# Patient Record
Sex: Male | Born: 1990 | Hispanic: Yes | Marital: Married | Smoking: Never smoker
Health system: Southern US, Community
[De-identification: ages and names within clinical notes are randomized; demographics above are authoritative.]

## PROBLEM LIST (undated history)

## (undated) DIAGNOSIS — J45909 Unspecified asthma, uncomplicated: Secondary | ICD-10-CM

## (undated) DIAGNOSIS — E119 Type 2 diabetes mellitus without complications: Secondary | ICD-10-CM

## (undated) DIAGNOSIS — K219 Gastro-esophageal reflux disease without esophagitis: Secondary | ICD-10-CM

## (undated) HISTORY — PX: NO PAST SURGERIES: SHX2092

## (undated) HISTORY — PX: APPENDECTOMY: SHX54

---

## 2016-08-17 DIAGNOSIS — R4586 Emotional lability: Secondary | ICD-10-CM | POA: Insufficient documentation

## 2016-08-17 DIAGNOSIS — IMO0002 Reserved for concepts with insufficient information to code with codable children: Secondary | ICD-10-CM | POA: Insufficient documentation

## 2017-05-27 DIAGNOSIS — Z131 Encounter for screening for diabetes mellitus: Secondary | ICD-10-CM | POA: Insufficient documentation

## 2017-10-06 DIAGNOSIS — R37 Sexual dysfunction, unspecified: Secondary | ICD-10-CM | POA: Insufficient documentation

## 2018-02-20 DIAGNOSIS — E1069 Type 1 diabetes mellitus with other specified complication: Secondary | ICD-10-CM | POA: Insufficient documentation

## 2018-02-20 DIAGNOSIS — E785 Hyperlipidemia, unspecified: Secondary | ICD-10-CM | POA: Insufficient documentation

## 2018-07-19 DIAGNOSIS — E559 Vitamin D deficiency, unspecified: Secondary | ICD-10-CM | POA: Insufficient documentation

## 2019-01-04 DIAGNOSIS — K8020 Calculus of gallbladder without cholecystitis without obstruction: Secondary | ICD-10-CM | POA: Insufficient documentation

## 2019-01-10 DIAGNOSIS — E1343 Other specified diabetes mellitus with diabetic autonomic (poly)neuropathy: Secondary | ICD-10-CM | POA: Insufficient documentation

## 2019-02-22 DIAGNOSIS — M14671 Charcot's joint, right ankle and foot: Secondary | ICD-10-CM | POA: Insufficient documentation

## 2019-02-22 DIAGNOSIS — M216X1 Other acquired deformities of right foot: Secondary | ICD-10-CM | POA: Insufficient documentation

## 2019-03-21 DIAGNOSIS — G894 Chronic pain syndrome: Secondary | ICD-10-CM | POA: Insufficient documentation

## 2019-04-30 DIAGNOSIS — Z9641 Presence of insulin pump (external) (internal): Secondary | ICD-10-CM | POA: Insufficient documentation

## 2019-06-28 DIAGNOSIS — R61 Generalized hyperhidrosis: Secondary | ICD-10-CM | POA: Insufficient documentation

## 2019-11-13 ENCOUNTER — Emergency Department: Payer: PRIVATE HEALTH INSURANCE

## 2019-11-13 ENCOUNTER — Other Ambulatory Visit: Payer: Self-pay

## 2019-11-13 ENCOUNTER — Inpatient Hospital Stay
Admission: EM | Admit: 2019-11-13 | Discharge: 2019-11-15 | DRG: 373 | Disposition: A | Discharge status: Home / Self Care | Payer: PRIVATE HEALTH INSURANCE | Attending: Surgery | Admitting: Surgery

## 2019-11-13 DIAGNOSIS — Z20828 Contact with and (suspected) exposure to other viral communicable diseases: Secondary | ICD-10-CM | POA: Diagnosis present

## 2019-11-13 DIAGNOSIS — Z794 Long term (current) use of insulin: Secondary | ICD-10-CM | POA: Diagnosis not present

## 2019-11-13 DIAGNOSIS — E109 Type 1 diabetes mellitus without complications: Secondary | ICD-10-CM | POA: Diagnosis present

## 2019-11-13 DIAGNOSIS — Z79899 Other long term (current) drug therapy: Secondary | ICD-10-CM

## 2019-11-13 DIAGNOSIS — K3532 Acute appendicitis with perforation and localized peritonitis, without abscess: Secondary | ICD-10-CM | POA: Diagnosis not present

## 2019-11-13 DIAGNOSIS — K3533 Acute appendicitis with perforation and localized peritonitis, with abscess: Principal | ICD-10-CM | POA: Diagnosis present

## 2019-11-13 HISTORY — DX: Type 2 diabetes mellitus without complications: E11.9

## 2019-11-13 LAB — CBC WITH DIFFERENTIAL/PLATELET
Abs Immature Granulocytes: 0.09 10*3/uL — ABNORMAL HIGH (ref 0.00–0.07)
Basophils Absolute: 0 10*3/uL (ref 0.0–0.1)
Basophils Relative: 0 %
Eosinophils Absolute: 0 10*3/uL (ref 0.0–0.5)
Eosinophils Relative: 0 %
HCT: 36.2 % — ABNORMAL LOW (ref 39.0–52.0)
Hemoglobin: 12.3 g/dL — ABNORMAL LOW (ref 13.0–17.0)
Immature Granulocytes: 1 %
Lymphocytes Relative: 12 %
Lymphs Abs: 1.3 10*3/uL (ref 0.7–4.0)
MCH: 31 pg (ref 26.0–34.0)
MCHC: 34 g/dL (ref 30.0–36.0)
MCV: 91.2 fL (ref 80.0–100.0)
Monocytes Absolute: 1.2 10*3/uL — ABNORMAL HIGH (ref 0.1–1.0)
Monocytes Relative: 11 %
Neutro Abs: 8.1 10*3/uL — ABNORMAL HIGH (ref 1.7–7.7)
Neutrophils Relative %: 76 %
Platelets: 251 10*3/uL (ref 150–400)
RBC: 3.97 MIL/uL — ABNORMAL LOW (ref 4.22–5.81)
RDW: 12.3 % (ref 11.5–15.5)
WBC: 10.7 10*3/uL — ABNORMAL HIGH (ref 4.0–10.5)
nRBC: 0 % (ref 0.0–0.2)

## 2019-11-13 LAB — URINALYSIS, COMPLETE (UACMP) WITH MICROSCOPIC
Bacteria, UA: NONE SEEN
Bilirubin Urine: NEGATIVE
Glucose, UA: >=500 mg/dL — AB
Ketones, ur: NEGATIVE mg/dL
Leukocytes,Ua: NEGATIVE
Nitrite: NEGATIVE
Protein, ur: 30 mg/dL — AB
Specific Gravity, Urine: 1.02 (ref 1.005–1.030)
Squamous Epithelial / HPF: NONE SEEN (ref 0–5)
pH: 5 (ref 5.0–8.0)

## 2019-11-13 LAB — GLUCOSE, CAPILLARY
Glucose-Capillary: 150 mg/dL — ABNORMAL HIGH (ref 70–99)
Glucose-Capillary: 62 mg/dL — ABNORMAL LOW (ref 70–99)

## 2019-11-13 LAB — COMPREHENSIVE METABOLIC PANEL
ALT: 26 U/L (ref 0–44)
AST: 25 U/L (ref 15–41)
Albumin: 3.5 g/dL (ref 3.5–5.0)
Alkaline Phosphatase: 145 U/L — ABNORMAL HIGH (ref 38–126)
Anion gap: 12 (ref 5–15)
BUN: 17 mg/dL (ref 6–20)
CO2: 26 mmol/L (ref 22–32)
Calcium: 8.6 mg/dL — ABNORMAL LOW (ref 8.9–10.3)
Chloride: 94 mmol/L — ABNORMAL LOW (ref 98–111)
Creatinine, Ser: 1.49 mg/dL — ABNORMAL HIGH (ref 0.61–1.24)
GFR calc Af Amer: >60 mL/min (ref >60)
GFR calc non Af Amer: >60 mL/min (ref >60)
Glucose, Bld: 255 mg/dL — ABNORMAL HIGH (ref 70–99)
Potassium: 3.5 mmol/L (ref 3.5–5.1)
Sodium: 132 mmol/L — ABNORMAL LOW (ref 135–145)
Total Bilirubin: 0.7 mg/dL (ref 0.3–1.2)
Total Protein: 7.7 g/dL (ref 6.5–8.1)

## 2019-11-13 LAB — RESPIRATORY PANEL BY RT PCR (FLU A&B, COVID)
Influenza A by PCR: NEGATIVE
Influenza B by PCR: NEGATIVE
SARS Coronavirus 2 by RT PCR: NEGATIVE

## 2019-11-13 LAB — LACTIC ACID, PLASMA: Lactic Acid, Venous: 0.9 mmol/L (ref 0.5–1.9)

## 2019-11-13 MED ORDER — PANTOPRAZOLE SODIUM 40 MG IV SOLR
40.0000 mg | Freq: Every day | INTRAVENOUS | Status: DC
  Administered 2019-11-14 (×2): 40 mg via INTRAVENOUS
  Filled 2019-11-13 (×2): qty 40

## 2019-11-13 MED ORDER — MORPHINE SULFATE (PF) 4 MG/ML IV SOLN
4.0000 mg | INTRAVENOUS | Status: DC | PRN
  Administered 2019-11-13 – 2019-11-14 (×2): 4 mg via INTRAVENOUS
  Filled 2019-11-13 (×2): qty 1

## 2019-11-13 MED ORDER — SODIUM CHLORIDE 0.9 % IV BOLUS
1000.0000 mL | Freq: Once | INTRAVENOUS | Status: AC
  Administered 2019-11-13: 1000 mL via INTRAVENOUS

## 2019-11-13 MED ORDER — SODIUM CHLORIDE 0.9% FLUSH: 3.0000 mL | Freq: Once | INTRAVENOUS | Status: DC

## 2019-11-13 MED ORDER — SODIUM CHLORIDE 0.9 % IV SOLN: INTRAVENOUS | Status: DC

## 2019-11-13 MED ORDER — SODIUM CHLORIDE 0.9 % IV SOLN: Freq: Once | INTRAVENOUS | Status: AC

## 2019-11-13 MED ORDER — INSULIN ASPART 100 UNIT/ML ~~LOC~~ SOLN
5.0000 [IU] | Freq: Once | SUBCUTANEOUS | Status: AC
  Administered 2019-11-13: 2 [IU] via INTRAVENOUS
  Filled 2019-11-13: qty 1

## 2019-11-13 MED ORDER — ACETAMINOPHEN 500 MG PO TABS
1000.0000 mg | ORAL_TABLET | Freq: Four times a day (QID) | ORAL | Status: DC
  Administered 2019-11-14 – 2019-11-15 (×2): 1000 mg via ORAL
  Filled 2019-11-13 (×3): qty 2

## 2019-11-13 MED ORDER — IOHEXOL 300 MG/ML  SOLN
100.0000 mL | Freq: Once | INTRAMUSCULAR | Status: AC | PRN
  Administered 2019-11-13: 100 mL via INTRAVENOUS
  Filled 2019-11-13: qty 100

## 2019-11-13 MED ORDER — METOPROLOL TARTRATE 5 MG/5ML IV SOLN: 5.0000 mg | Freq: Four times a day (QID) | INTRAVENOUS | Status: DC | PRN

## 2019-11-13 MED ORDER — ONDANSETRON HCL 4 MG/2ML IJ SOLN
4.0000 mg | Freq: Four times a day (QID) | INTRAMUSCULAR | Status: DC | PRN
  Administered 2019-11-14 – 2019-11-15 (×4): 4 mg via INTRAVENOUS
  Filled 2019-11-13 (×4): qty 2

## 2019-11-13 MED ORDER — PIPERACILLIN-TAZOBACTAM 3.375 G IVPB
3.3750 g | Freq: Three times a day (TID) | INTRAVENOUS | Status: DC
  Administered 2019-11-14 – 2019-11-15 (×5): 3.375 g via INTRAVENOUS
  Filled 2019-11-13 (×5): qty 50

## 2019-11-13 MED ORDER — ONDANSETRON 4 MG PO TBDP: 4.0000 mg | ORAL_TABLET | Freq: Four times a day (QID) | ORAL | Status: DC | PRN

## 2019-11-13 MED ORDER — PROCHLORPERAZINE EDISYLATE 10 MG/2ML IJ SOLN
5.0000 mg | Freq: Four times a day (QID) | INTRAMUSCULAR | Status: DC | PRN
  Administered 2019-11-14 – 2019-11-15 (×3): 10 mg via INTRAVENOUS
  Filled 2019-11-13 (×3): qty 2

## 2019-11-13 MED ORDER — INSULIN ASPART 100 UNIT/ML ~~LOC~~ SOLN: 0.0000 [IU] | Freq: Every day | SUBCUTANEOUS | Status: DC

## 2019-11-13 MED ORDER — ONDANSETRON HCL 4 MG/2ML IJ SOLN
4.0000 mg | Freq: Once | INTRAMUSCULAR | Status: AC
  Administered 2019-11-13: 4 mg via INTRAVENOUS
  Filled 2019-11-13: qty 2

## 2019-11-13 MED ORDER — PROCHLORPERAZINE MALEATE 10 MG PO TABS
10.0000 mg | ORAL_TABLET | Freq: Four times a day (QID) | ORAL | Status: DC | PRN
  Filled 2019-11-13: qty 1

## 2019-11-13 MED ORDER — INSULIN ASPART 100 UNIT/ML ~~LOC~~ SOLN: 4.0000 [IU] | Freq: Three times a day (TID) | SUBCUTANEOUS | Status: DC

## 2019-11-13 MED ORDER — PIPERACILLIN-TAZOBACTAM 3.375 G IVPB 30 MIN
3.3750 g | Freq: Once | INTRAVENOUS | Status: AC
  Administered 2019-11-13: 3.375 g via INTRAVENOUS
  Filled 2019-11-13: qty 50

## 2019-11-13 MED ORDER — MORPHINE SULFATE (PF) 4 MG/ML IV SOLN
4.0000 mg | INTRAVENOUS | Status: DC | PRN
  Administered 2019-11-14 (×2): 4 mg via INTRAVENOUS
  Filled 2019-11-13 (×2): qty 1

## 2019-11-13 MED ORDER — KETOROLAC TROMETHAMINE 30 MG/ML IJ SOLN
15.0000 mg | Freq: Four times a day (QID) | INTRAMUSCULAR | Status: DC | PRN
  Administered 2019-11-14: 15 mg via INTRAVENOUS
  Filled 2019-11-13: qty 1

## 2019-11-13 MED ORDER — INSULIN ASPART 100 UNIT/ML ~~LOC~~ SOLN: 0.0000 [IU] | Freq: Three times a day (TID) | SUBCUTANEOUS | Status: DC

## 2019-11-13 MED ORDER — OXYCODONE HCL 5 MG PO TABS: 5.0000 mg | ORAL_TABLET | ORAL | Status: DC | PRN

## 2019-11-13 NOTE — ED Triage Notes (Signed)
Pt c/o foul smelling, dark brown urine and right flank pain, n/v x3 days. Pt denies difficulty urinating, was seen recently and dx with possible kidney stone.

## 2019-11-13 NOTE — ED Provider Notes (Addendum)
[facility] Emergency Department Provider Note    First MD Initiated Contact with Patient 11/13/19 1955     (approximate)  I have reviewed the triage vital signs and the nursing notes.   HISTORY  Chief Complaint Flank Pain    HPI Richard [name] is a 28 y.o. male history of diabetes presents the ER for 3 days of worsening right flank pain right lower quadrant discomfort.  Has been having some chills but no measured fevers at home.  Was having nausea.  States he was unable to keep anything down but try to eat something around 4:00 today.  States has been having darker colored urine.  Thinks that may be related to kidney stones.  Does have a family history of kidney stones.  No previous intra-abdominal surgeries.  No diarrhea or constipation.     Past Medical History:  Diagnosis Date  . Diabetes mellitus without complication (HCC)    type one   History reviewed. No pertinent family history. History reviewed. No pertinent surgical history. There are no problems to display for this patient.     Prior to Admission medications   Not on File    Allergies Sulfa antibiotics    Social History Social History   Tobacco Use  . Smoking status: Never Smoker  . Smokeless tobacco: Never Used  Substance Use Topics  . Alcohol use: Never  . Drug use: Never    Review of Systems Patient denies headaches, rhinorrhea, blurry vision, numbness, shortness of breath, chest pain, edema, cough, abdominal pain, nausea, vomiting, diarrhea, dysuria, fevers, rashes or hallucinations unless otherwise stated above in HPI. ____________________________________________   PHYSICAL EXAM:  VITAL SIGNS: Vitals:   11/13/19 1831 11/13/19 1848  BP: 134/77   Pulse: (!) 120   Resp: (!) 28   Temp: (!) 102.2 F (39 C)   SpO2: 98% 98%    Constitutional: Alert and oriented.  Eyes: Conjunctivae are normal.  Head: Atraumatic. Nose: No  congestion/rhinnorhea. Mouth/Throat: Mucous membranes are moist.   Neck: No stridor. Painless ROM.  Cardiovascular: Normal rate, regular rhythm. Grossly normal heart sounds.  Good peripheral circulation. Respiratory: Normal respiratory effort.  No retractions. Lungs CTAB. Gastrointestinal: Soft  With mild ttp in RLQ, no guarding. No distention. No abdominal bruits. No CVA tenderness. Genitourinary:  Musculoskeletal: No lower extremity tenderness nor edema.  No joint effusions. Neurologic:  Normal speech and language. No gross focal neurologic deficits are appreciated. No facial droop Skin:  Skin is warm, dry and intact. No rash noted. Psychiatric: Mood and affect are normal. Speech and behavior are normal.  ____________________________________________   LABS (all labs ordered are listed, but only abnormal results are displayed)  Results for orders placed or performed during the hospital encounter of 11/13/19 (from the past 24 hour(s))  Lactic acid, plasma     Status: None   Collection Time: 11/13/19  6:38 PM  Result Value Ref Range   Lactic Acid, Venous 0.9 0.5 - 1.9 mmol/L  Comprehensive metabolic panel     Status: Abnormal   Collection Time: 11/13/19  6:38 PM  Result Value Ref Range   Sodium 132 (L) 135 - 145 mmol/L   Potassium 3.5 3.5 - 5.1 mmol/L   Chloride 94 (L) 98 - 111 mmol/L   CO2 26 22 - 32 mmol/L   Glucose, Bld 255 (H) 70 - 99 mg/dL   BUN 17 6 - 20 mg/dL   Creatinine, Ser 9.561.49 (H) 0.61 - 1.24 mg/dL   Calcium 8.6 (  L) 8.9 - 10.3 mg/dL   Total Protein 7.7 6.5 - 8.1 g/dL   Albumin 3.5 3.5 - 5.0 g/dL   AST 25 15 - 41 U/L   ALT 26 0 - 44 U/L   Alkaline Phosphatase 145 (H) 38 - 126 U/L   Total Bilirubin 0.7 0.3 - 1.2 mg/dL   GFR calc non Af Amer >60 >60 mL/min   GFR calc Af Amer >60 >60 mL/min   Anion gap 12 5 - 15  CBC with Differential     Status: Abnormal   Collection Time: 11/13/19  6:38 PM  Result Value Ref Range   WBC 10.7 (H) 4.0 - 10.5 K/uL   RBC 3.97 (L)  4.22 - 5.81 MIL/uL   Hemoglobin 12.3 (L) 13.0 - 17.0 g/dL   HCT 36.2 (L) 39.0 - 52.0 %   MCV 91.2 80.0 - 100.0 fL   MCH 31.0 26.0 - 34.0 pg   MCHC 34.0 30.0 - 36.0 g/dL   RDW 12.3 11.5 - 15.5 %   Platelets 251 150 - 400 K/uL   nRBC 0.0 0.0 - 0.2 %   Neutrophils Relative % 76 %   Neutro Abs 8.1 (H) 1.7 - 7.7 K/uL   Lymphocytes Relative 12 %   Lymphs Abs 1.3 0.7 - 4.0 K/uL   Monocytes Relative 11 %   Monocytes Absolute 1.2 (H) 0.1 - 1.0 K/uL   Eosinophils Relative 0 %   Eosinophils Absolute 0.0 0.0 - 0.5 K/uL   Basophils Relative 0 %   Basophils Absolute 0.0 0.0 - 0.1 K/uL   Immature Granulocytes 1 %   Abs Immature Granulocytes 0.09 (H) 0.00 - 0.07 K/uL  Urinalysis, Complete w Microscopic     Status: Abnormal   Collection Time: 11/13/19  6:38 PM  Result Value Ref Range   Color, Urine YELLOW (A) YELLOW   APPearance CLEAR (A) CLEAR   Specific Gravity, Urine 1.020 1.005 - 1.030   pH 5.0 5.0 - 8.0   Glucose, UA >=500 (A) NEGATIVE mg/dL   Hgb urine dipstick SMALL (A) NEGATIVE   Bilirubin Urine NEGATIVE NEGATIVE   Ketones, ur NEGATIVE NEGATIVE mg/dL   Protein, ur 30 (A) NEGATIVE mg/dL   Nitrite NEGATIVE NEGATIVE   Leukocytes,Ua NEGATIVE NEGATIVE   RBC / HPF 0-5 0 - 5 RBC/hpf   WBC, UA 0-5 0 - 5 WBC/hpf   Bacteria, UA NONE SEEN NONE SEEN   Squamous Epithelial / LPF NONE SEEN 0 - 5   Mucus PRESENT   Glucose, capillary     Status: Abnormal   Collection Time: 11/13/19  9:09 PM  Result Value Ref Range   Glucose-Capillary 150 (H) 70 - 99 mg/dL   ____________________________________________ _______________________________  RADIOLOGY  I personally reviewed all radiographic images ordered to evaluate for the above acute complaints and reviewed radiology reports and findings.  These findings were personally discussed with the patient.  Please see medical record for radiology report.  ____________________________________________   PROCEDURES  Procedure(s) performed:  .Critical  Care Performed by: Merlyn Lot, MD Authorized by: Merlyn Lot, MD   Critical care provider statement:    Critical care time (minutes):  30   Critical care was necessary to treat or prevent imminent or life-threatening deterioration of the following conditions: appendicits with perforation and abscess.   Critical care was time spent personally by me on the following activities:  Discussions with consultants, evaluation of patient's response to treatment, examination of patient, ordering and performing treatments and interventions, ordering and  review of laboratory studies, ordering and review of radiographic studies, pulse oximetry, re-evaluation of patient's condition, obtaining history from patient or surrogate and review of old charts      Critical Care performed: yes ____________________________________________   INITIAL IMPRESSION / ASSESSMENT AND PLAN / ED COURSE  Pertinent labs & imaging results that were available during my care of the patient were reviewed by me and considered in my medical decision making (see chart for details).   DDX: Undecided, colitis, perforation, abscess, stone, pyelonephritis  Richard [name] is a 28 y.o. who presents to the ED with symptoms as described above.  Patient febrile tachycardic but fairly well-appearing clinically.  Given his history of diabetes and progressively worsening right lower quadrant pain CT imaging ordered due to concern for the by differential.  Urinalysis without any evidence of infection.  Mild leukocytosis.  IV fluids started.  Patient found to have acute appendicitis with perforation abscess.  IV antibiotics initiated.  Discussed case in consultation with Dr. Everlene Farrier of general surgery who will admit patient for further management.  Insulin given for hyperglycemia.  Have discussed with the patient and available family all diagnostics and treatments performed thus far and all questions were answered to the best of my  ability. The patient demonstrates understanding and agreement with plan.   Clinical Course as of Nov 12 2121  Tue Nov 13, 2019  2117 Case discussed with Dr. Everlene Farrier.  ALT: 26 [PR]  2121 Patient reassessed.  Remains clinically stable.  Blood sugar improving.  Heart rate improving.  Antibiotics infused.   [PR]    Clinical Course User Index [PR] Willy Eddy, MD    The patient was evaluated in Emergency Department today for the symptoms described in the history of present illness. He/she was evaluated in the context of the global COVID-19 pandemic, which necessitated consideration that the patient might be at risk for infection with the SARS-CoV-2 virus that causes COVID-19. Institutional protocols and algorithms that pertain to the evaluation of patients at risk for COVID-19 are in a state of rapid change based on information released by regulatory bodies including the CDC and federal and state organizations. These policies and algorithms were followed during the patient's care in the ED.  As part of my medical decision making, I reviewed the following data within the electronic MEDICAL RECORD NUMBER Nursing notes reviewed and incorporated, Labs reviewed, notes from prior ED visits and [place] Controlled Substance Database   ____________________________________________   FINAL CLINICAL IMPRESSION(S) / ED DIAGNOSES  Final diagnoses:  Acute appendicitis with perforation, localized peritonitis, and abscess, unspecified whether gangrene present      NEW MEDICATIONS STARTED DURING THIS VISIT:  New Prescriptions   No medications on file     Note:  This document was prepared using Dragon voice recognition software and may include unintentional dictation errors.    Willy Eddy, MD 11/13/19 2121    Willy Eddy, MD 11/13/19 2122

## 2019-11-13 NOTE — Progress Notes (Signed)
D/W Dr. Quentin Cornwall 28 yo w abd pain, CT pers. Reviewed showing appendicitis w abscess Phlegmon and significant inflammatory response on RLQ. Per Dr. Quentin Cornwall pt walking and not peritonitic. We will do aggressive fluid resuscitation, insulin for DM and Place order for IR drainage. Unsure if Ir will be able to perform it. If he does not improve may need surgical intervention that may consist of Right colectomy given inflammation. We will try less aggressive approach first.

## 2019-11-14 ENCOUNTER — Encounter: Payer: Self-pay | Admitting: Surgery

## 2019-11-14 ENCOUNTER — Other Ambulatory Visit: Payer: Self-pay

## 2019-11-14 DIAGNOSIS — K3533 Acute appendicitis with perforation and localized peritonitis, with abscess: Principal | ICD-10-CM

## 2019-11-14 LAB — GLUCOSE, CAPILLARY
Glucose-Capillary: 144 mg/dL — ABNORMAL HIGH (ref 70–99)
Glucose-Capillary: 147 mg/dL — ABNORMAL HIGH (ref 70–99)
Glucose-Capillary: 177 mg/dL — ABNORMAL HIGH (ref 70–99)
Glucose-Capillary: 222 mg/dL — ABNORMAL HIGH (ref 70–99)
Glucose-Capillary: 229 mg/dL — ABNORMAL HIGH (ref 70–99)
Glucose-Capillary: 58 mg/dL — ABNORMAL LOW (ref 70–99)
Glucose-Capillary: 79 mg/dL (ref 70–99)

## 2019-11-14 LAB — HEMOGLOBIN A1C
Hgb A1c MFr Bld: 7.3 % — ABNORMAL HIGH (ref 4.8–5.6)
Mean Plasma Glucose: 162.81 mg/dL

## 2019-11-14 LAB — CBC
HCT: 32.8 % — ABNORMAL LOW (ref 39.0–52.0)
Hemoglobin: 11.3 g/dL — ABNORMAL LOW (ref 13.0–17.0)
MCH: 30.5 pg (ref 26.0–34.0)
MCHC: 34.5 g/dL (ref 30.0–36.0)
MCV: 88.6 fL (ref 80.0–100.0)
Platelets: 230 10*3/uL (ref 150–400)
RBC: 3.7 MIL/uL — ABNORMAL LOW (ref 4.22–5.81)
RDW: 12.7 % (ref 11.5–15.5)
WBC: 10.8 10*3/uL — ABNORMAL HIGH (ref 4.0–10.5)
nRBC: 0 % (ref 0.0–0.2)

## 2019-11-14 LAB — COMPREHENSIVE METABOLIC PANEL
ALT: 29 U/L (ref 0–44)
AST: 34 U/L (ref 15–41)
Albumin: 2.8 g/dL — ABNORMAL LOW (ref 3.5–5.0)
Alkaline Phosphatase: 120 U/L (ref 38–126)
Anion gap: 9 (ref 5–15)
BUN: 13 mg/dL (ref 6–20)
CO2: 26 mmol/L (ref 22–32)
Calcium: 7.7 mg/dL — ABNORMAL LOW (ref 8.9–10.3)
Chloride: 102 mmol/L (ref 98–111)
Creatinine, Ser: 1.23 mg/dL (ref 0.61–1.24)
GFR calc Af Amer: >60 mL/min (ref >60)
GFR calc non Af Amer: >60 mL/min (ref >60)
Glucose, Bld: 65 mg/dL — ABNORMAL LOW (ref 70–99)
Potassium: 3.3 mmol/L — ABNORMAL LOW (ref 3.5–5.1)
Sodium: 137 mmol/L (ref 135–145)
Total Bilirubin: 0.7 mg/dL (ref 0.3–1.2)
Total Protein: 6.6 g/dL (ref 6.5–8.1)

## 2019-11-14 LAB — APTT: aPTT: 43 seconds — ABNORMAL HIGH (ref 24–36)

## 2019-11-14 LAB — PROTIME-INR
INR: 1.1 (ref 0.8–1.2)
Prothrombin Time: 14.3 seconds (ref 11.4–15.2)

## 2019-11-14 LAB — MAGNESIUM: Magnesium: 1.8 mg/dL (ref 1.7–2.4)

## 2019-11-14 MED ORDER — DEXTROSE 50 % IV SOLN
INTRAVENOUS | Status: AC
  Administered 2019-11-14: 50 mL
  Filled 2019-11-14: qty 50

## 2019-11-14 MED ORDER — INSULIN ASPART 100 UNIT/ML ~~LOC~~ SOLN
0.0000 [IU] | SUBCUTANEOUS | Status: DC
  Administered 2019-11-14 (×2): 2 [IU] via SUBCUTANEOUS
  Administered 2019-11-15 (×4): 1 [IU] via SUBCUTANEOUS
  Filled 2019-11-14 (×5): qty 1

## 2019-11-14 MED ORDER — INSULIN GLARGINE 100 UNIT/ML ~~LOC~~ SOLN
15.0000 [IU] | Freq: Two times a day (BID) | SUBCUTANEOUS | Status: DC
  Administered 2019-11-14 – 2019-11-15 (×3): 15 [IU] via SUBCUTANEOUS
  Filled 2019-11-14 (×5): qty 0.15

## 2019-11-14 MED ORDER — INSULIN ASPART 100 UNIT/ML ~~LOC~~ SOLN
0.0000 [IU] | Freq: Three times a day (TID) | SUBCUTANEOUS | Status: DC
  Filled 2019-11-14: qty 1

## 2019-11-14 NOTE — Progress Notes (Signed)
   11/14/19 1500  Clinical Encounter Type  Visited With Patient  Visit Type Initial;Spiritual support  Referral From Chaplain  Consult/Referral To Chaplain  Spiritual Encounters  Spiritual Needs Emotional  Stress Factors  Patient Stress Factors Other (Comment)  Chaplain was rounding and visit patient. Patient was lying in bed with lights off. Chaplain introduced herself and ask patient how he was doing, patient was a little upset about not having surgery. Chaplain presented active listening and empathy with patient. Chaplain help patient process the surgery will happened when it is best for him and his body. Patient agreed, Chaplain ask patient was there anything else she could do for him and he said "get someone to stop this beeping. Chaplain told nurse sec.

## 2019-11-14 NOTE — Progress Notes (Addendum)
Inpatient Diabetes Program Recommendations  AACE/ADA: New Consensus Statement on Inpatient Glycemic Control (2015)  Target Ranges:  Prepandial:   less than 140 mg/dL      Peak postprandial:   less than 180 mg/dL (1-2 hours)      Critically ill patients:  140 - 180 mg/dL   Lab Results  Component Value Date   GLUCAP 144 (H) 11/14/2019   HGBA1C 7.3 (H) 11/14/2019    Review of Glycemic Control Results for Richard [name], Richard [name] (MRN [id number]) as of 11/14/2019 09:59  Ref. Range 11/13/2019 23:08 11/14/2019 00:06 11/14/2019 06:05 11/14/2019 06:24 11/14/2019 07:45  Glucose-Capillary Latest Ref Range: 70 - 99 mg/dL 62 (L) 79 58 (L) 177 (H) 144 (H)   Diabetes history: DM 1 (diagnosed at age 28) Outpatient Diabetes medications:  Humalog insulin on Tandem pump with Dexcom sensor Basal rate:  1.25 units/hr (30 units TDD basal insulin daily) 1 unit for every 8 grams of CHO 1 unit drops blood sugar approximately 40 mg/dL- Goal =120 mg/dL Current orders for Inpatient glycemic control:  Novolog resistant tid with meals and HS (11/13/19) Lantus 15 units bid (11/14/19) Inpatient Diabetes Program Recommendations:    Patient is currently wearing insulin pump.  He is going to surgery today.   Thanks,  Adah Perl, RN, BC-ADM Inpatient Diabetes Coordinator Pager [phone] (8a-5p)  Addendum 10:30 a- Spoke with patient by phone.  He has already received Lantus 15 units and plans to take his pump off around noon prior to surgery.  He states that he plans to restart insulin pump tomorrow afternoon (20 hours after last dose of Lantus).  Patient very well informed about his diabetes and control has been excellent.  Notified him of plans to reduce his Novolog correction and add Novolog meal coverage 1 unit/8 grams of CHO.  Also informed patient to talk to RN if he feels insulin doses are too much or not enough so that MD can be called as needed.  Patient verbalized understanding.  He currently does not have  Continuous glucose sensor on and does finger sticks.

## 2019-11-14 NOTE — H&P (Signed)
[facility] SURGICAL HISTORY & PHYSICAL (cpt [id number])  HISTORY OF PRESENT ILLNESS (HPI):  28 y.o. male presented to [facility] ED yesterday evening for for abdominal pain. Patient reports a 3 days history of abdominal. At the onset, this was a generalized aching pain. However over the next few days this worsened, became sharp, and localized to his RLQ. Nothing made the pain better. He did endorse associated fever at home to 100F. No chills, cough, congestion, CP, SOB, nausea, or emesis. No history of similar pain in the past. No previous abdominal surgeries. Work up in the Ed was concerning for mild leukocytosis, slight AKI, and Appendicitis with abscess on CT.   General surgery was consulted by emergency medicine physician Dr Willy EddyPatrick Robinson, MD for evaluation and management of appendicitis with abscess.      PAST MEDICAL HISTORY (PMH):  Past Medical History:  Diagnosis Date  . Diabetes mellitus without complication (HCC)    type one    Reviewed. Otherwise negative.   PAST SURGICAL HISTORY (PSH):  History reviewed. No pertinent surgical history.  Reviewed. Otherwise negative.   MEDICATIONS:  Prior to Admission medications   Medication Sig Start Date End Date Taking? Authorizing Provider  ARIPiprazole (ABILIFY) 30 MG tablet Take 30 mg by mouth daily. 10/29/19  Yes [provider]  busPIRone (BUSPAR) 15 MG tablet Take 15 mg by mouth 3 (three) times daily. 10/29/19  Yes [provider]  DULoxetine (CYMBALTA) 30 MG capsule Take 90 mg by mouth daily. 10/25/19  Yes [provider]  HUMALOG 100 UNIT/ML injection ON TANDEM PUMP WILL USE GREATER THAN 250 UNITS PER 3 DAYS OVER 2 500 UNITS PER MONTH (WILL REPLACE APIDRA ONCE ON INSULIN PUMP) 11/07/19  Yes [provider]  Insulin Glargine (LANTUS) 100 UNIT/ML Solostar Pen Off-pump: Lantus/Levemir or Basaglar pen: 22 u as soon as off pump/ea.24 hrs off pump. Restart pump after 20 hrs from last longacting  shot. 04/30/19  Yes [provider]  lamoTRIgine (LAMICTAL) 100 MG tablet Take 200 mg by mouth daily. 10/29/19  Yes [provider]  pregabalin (LYRICA) 300 MG capsule Take 300 mg by mouth 2 (two) times daily. 11/07/19  Yes [provider]  propranolol ER (INDERAL LA) 60 MG 24 hr capsule Take 60 mg by mouth daily. 11/07/19  Yes [provider]  tiaGABine (GABITRIL) 4 MG tablet Take 8 mg by mouth at bedtime. 10/30/19  Yes [provider]  traMADol (ULTRAM) 50 MG tablet Take 50 mg by mouth 2 (two) times daily as needed. 10/26/19  Yes [provider]  zolpidem (AMBIEN) 10 MG tablet Take 10 mg by mouth at bedtime as needed. 07/10/19  Yes [provider]     ALLERGIES:  Allergies  Allergen Reactions  . Sulfa Antibiotics Anaphylaxis     SOCIAL HISTORY:  Social History   Socioeconomic History  . Marital status: Married    Spouse name: Not on file  . Number of children: Not on file  . Years of education: Not on file  . Highest education level: Not on file  Occupational History  . Not on file  Tobacco Use  . Smoking status: Never Smoker  . Smokeless tobacco: Never Used  Substance and Sexual Activity  . Alcohol use: Never  . Drug use: Never  . Sexual activity: Not on file  Other Topics Concern  . Not on file  Social History Narrative  . Not on file   Social Determinants of Health   Financial Resource  Strain:   . Difficulty of Paying Living Expenses: Not on file  Food Insecurity:   . Worried About Programme researcher, broadcasting/film/video in the Last Year: Not on file  . Ran Out of Food in the Last Year: Not on file  Transportation Needs:   . Lack of Transportation (Medical): Not on file  . Lack of Transportation (Non-Medical): Not on file  Physical Activity:   . Days of Exercise per Week: Not on file  . Minutes of Exercise per Session: Not on file  Stress:   . Feeling of Stress : Not on file  Social Connections:   . Frequency of  Communication with Friends and Family: Not on file  . Frequency of Social Gatherings with Friends and Family: Not on file  . Attends Religious Services: Not on file  . Active Member of Clubs or Organizations: Not on file  . Attends Banker Meetings: Not on file  . Marital Status: Not on file  Intimate Partner Violence:   . Fear of Current or Ex-Partner: Not on file  . Emotionally Abused: Not on file  . Physically Abused: Not on file  . Sexually Abused: Not on file     FAMILY HISTORY:  History reviewed. No pertinent family history.  Otherwise negative.   REVIEW OF SYSTEMS:  Review of Systems  Constitutional: Positive for fever. Negative for chills.  HENT: Negative for congestion and sore throat.   Respiratory: Negative for cough and shortness of breath.   Cardiovascular: Negative for chest pain and palpitations.  Gastrointestinal: Positive for abdominal pain. Negative for blood in stool, constipation, diarrhea, nausea and vomiting.  Genitourinary: Negative for dysuria and urgency.  All other systems reviewed and are negative.   VITAL SIGNS:  Temp:  [99.1 F (37.3 C)-102.2 F (39 C)] 99.2 F (37.3 C) (12/23 0438) Pulse Rate:  [105-120] 109 (12/23 0438) Resp:  [18-28] 18 (12/23 0438) BP: (105-140)/(51-84) 140/84 (12/23 0438) SpO2:  [94 %-98 %] 98 % (12/23 0438) Weight:  [79.8 kg] 79.8 kg (12/22 1832)     Height: 5\' 1"  (154.9 cm) Weight: 79.8 kg BMI (Calculated): 33.27   PHYSICAL EXAM:  Physical Exam Vitals and nursing note reviewed.  Constitutional:      Appearance: Normal appearance. He is obese.  Eyes:     General: No scleral icterus.    Conjunctiva/sclera: Conjunctivae normal.  Cardiovascular:     Rate and Rhythm: Normal rate and regular rhythm.     Pulses: Normal pulses.     Heart sounds: Normal heart sounds. No murmur. No friction rub. No gallop.   Pulmonary:     Effort: Pulmonary effort is normal. No respiratory distress.     Breath sounds: Normal  breath sounds. No stridor. No wheezing.  Abdominal:     General: Abdomen is protuberant. There is no distension.     Palpations: Abdomen is soft.     Tenderness: There is abdominal tenderness in the right lower quadrant. There is no guarding or rebound. Positive signs include McBurney's sign. Negative signs include Rovsing's sign.  Genitourinary:    Comments: Deferred Musculoskeletal:     Right lower leg: No edema.     Left lower leg: No edema.  Skin:    General: Skin is warm and dry.     Coloration: Skin is not pale.     Findings: No erythema.  Neurological:     General: No focal deficit present.     Mental Status: He is alert and oriented to  person, place, and time.  Psychiatric:        Mood and Affect: Mood normal.        Behavior: Behavior normal.     INTAKE/OUTPUT:  This shift: No intake/output data recorded.  Last 2 shifts: @IOLAST2SHIFTS @  Labs:  CBC Latest Ref Rng & Units 11/14/2019 11/13/2019  WBC 4.0 - 10.5 K/uL 10.8(H) 10.7(H)  Hemoglobin 13.0 - 17.0 g/dL 11.3(L) 12.3(L)  Hematocrit 39.0 - 52.0 % 32.8(L) 36.2(L)  Platelets 150 - 400 K/uL 230 251   CMP Latest Ref Rng & Units 11/14/2019 11/13/2019  Glucose 70 - 99 mg/dL 65(L) 255(H)  BUN 6 - 20 mg/dL 13 17  Creatinine 0.61 - 1.24 mg/dL 1.23 1.49(H)  Sodium 135 - 145 mmol/L 137 132(L)  Potassium 3.5 - 5.1 mmol/L 3.3(L) 3.5  Chloride 98 - 111 mmol/L 102 94(L)  CO2 22 - 32 mmol/L 26 26  Calcium 8.9 - 10.3 mg/dL 7.7(L) 8.6(L)  Total Protein 6.5 - 8.1 g/dL 6.6 7.7  Total Bilirubin 0.3 - 1.2 mg/dL 0.7 0.7  Alkaline Phos 38 - 126 U/L 120 145(H)  AST 15 - 41 U/L 34 25  ALT 0 - 44 U/L 29 26     Imaging studies:   CT Abdomen/Pelvis (11/13/2019) personally reviewed with inflammatory changes surrounding the appendix with abscess, and radiologist report reviewed:  IMPRESSION: Perforated acute appendicitis with a 4.0 x 4.0 x 4.2 cm diameter gas and fluid collection adjacent to the appendix, medial cecum,  and terminal ileum.    Assessment/Plan: (ICD-10's: K35.33) 28 y.o. male with leukocytosis and abdominal pain attributed to complicated appendicitis with abscess, complicated by pertinent comorbidities including T1DM.    - Admit to general surgery  - NPO + IVF  - IV ABx (Zosyn)  - Plan for IR drainage of abscess  - Attempt conservative management; plan for interval appendectomy. He understands if this were to fail or he clinically deteriorates then he will require more urgent intervention.    - monitor leukocytosis   - medical management of comorbid conditions; home insulin   - DVT prophylaxis; hold for IR  All of the above findings and recommendations were discussed with the patient, and all of his questions were answered to his expressed satisfaction.  -- Edison Simon, PA-C [facility] 11/14/2019, 7:05 AM [phone] M-F: 7am - 4pm

## 2019-11-14 NOTE — ED Notes (Signed)
Pt notified that bed is being cleaned.

## 2019-11-14 NOTE — Progress Notes (Signed)
Interventional Radiology Brief Note  28 year-old male with perforated appendicitis.  IR was consulted to evaluate for possible percutaneous drainage.  After personally reviewing his CT scan, it appears that the appendix is ruptured at the base resulting in an abscess cavity along the mesenteric margin of the cecum.  The abscess cavity communicates with the cecum through this defect and the cecum nearly completely encircles the abscess.  A perc drain would likely have to pass through the antimesenteric wall of the cecum to enter the abscess cavity which is not ideal.   I discussed these findings with Dr. Dahlia Byes who agrees this is a challenge.  Our plan is to continue abx and repeat the CT scan with both IV and oral contrast in 48-72 hours and then re-evaluate for either perc drain placement vs. Surgery.    Signed,  Criselda Peaches, MD

## 2019-11-14 NOTE — Progress Notes (Addendum)
Hypoglycemic Event  CBG: 58  Treatment: D50 25 mL (12.5 gm)  Symptoms: Pale  Follow-up CBG: [id number] CBG Result:177  Possible Reasons for Event: Vomiting  Comments/MD notified:yes, Dr. Dahlia Byes via page    [place]

## 2019-11-15 ENCOUNTER — Inpatient Hospital Stay (HOSPITAL_COMMUNITY)
Admission: EM | Admit: 2019-11-15 | Discharge: 2019-11-17 | Disposition: A | Discharge status: Home / Self Care | Payer: PRIVATE HEALTH INSURANCE | Source: Home / Self Care | Attending: General Surgery | Admitting: General Surgery

## 2019-11-15 ENCOUNTER — Other Ambulatory Visit: Payer: Self-pay

## 2019-11-15 ENCOUNTER — Encounter: Payer: Self-pay | Admitting: Emergency Medicine

## 2019-11-15 ENCOUNTER — Telehealth: Payer: Self-pay | Admitting: General Surgery

## 2019-11-15 DIAGNOSIS — K3532 Acute appendicitis with perforation and localized peritonitis, without abscess: Secondary | ICD-10-CM | POA: Diagnosis present

## 2019-11-15 LAB — CBC
HCT: 32.1 % — ABNORMAL LOW (ref 39.0–52.0)
Hemoglobin: 10.9 g/dL — ABNORMAL LOW (ref 13.0–17.0)
MCH: 30.7 pg (ref 26.0–34.0)
MCHC: 34 g/dL (ref 30.0–36.0)
MCV: 90.4 fL (ref 80.0–100.0)
Platelets: 270 10*3/uL (ref 150–400)
RBC: 3.55 MIL/uL — ABNORMAL LOW (ref 4.22–5.81)
RDW: 12.7 % (ref 11.5–15.5)
WBC: 8.9 10*3/uL (ref 4.0–10.5)
nRBC: 0 % (ref 0.0–0.2)

## 2019-11-15 LAB — GLUCOSE, CAPILLARY
Glucose-Capillary: 165 mg/dL — ABNORMAL HIGH (ref 70–99)
Glucose-Capillary: 169 mg/dL — ABNORMAL HIGH (ref 70–99)
Glucose-Capillary: 173 mg/dL — ABNORMAL HIGH (ref 70–99)
Glucose-Capillary: 173 mg/dL — ABNORMAL HIGH (ref 70–99)
Glucose-Capillary: 192 mg/dL — ABNORMAL HIGH (ref 70–99)

## 2019-11-15 MED ORDER — INSULIN ASPART 100 UNIT/ML ~~LOC~~ SOLN: 0.0000 [IU] | SUBCUTANEOUS | Status: DC

## 2019-11-15 MED ORDER — ZOLPIDEM TARTRATE 5 MG PO TABS: 10.0000 mg | ORAL_TABLET | Freq: Every evening | ORAL | Status: DC | PRN

## 2019-11-15 MED ORDER — INSULIN GLARGINE 100 UNIT/ML ~~LOC~~ SOLN: 22.0000 [IU] | Freq: Every day | SUBCUTANEOUS | Status: DC

## 2019-11-15 MED ORDER — KETOROLAC TROMETHAMINE 30 MG/ML IJ SOLN: 30.0000 mg | Freq: Four times a day (QID) | INTRAMUSCULAR | Status: DC | PRN

## 2019-11-15 MED ORDER — PIPERACILLIN-TAZOBACTAM 3.375 G IVPB
3.3750 g | Freq: Three times a day (TID) | INTRAVENOUS | Status: DC
  Administered 2019-11-16 – 2019-11-17 (×4): 3.375 g via INTRAVENOUS
  Filled 2019-11-15 (×4): qty 50

## 2019-11-15 MED ORDER — KETOROLAC TROMETHAMINE 30 MG/ML IJ SOLN
30.0000 mg | Freq: Once | INTRAMUSCULAR | Status: AC
  Administered 2019-11-15: 30 mg via INTRAVENOUS
  Filled 2019-11-15: qty 1

## 2019-11-15 MED ORDER — HYDROMORPHONE HCL 1 MG/ML IJ SOLN: 0.5000 mg | INTRAMUSCULAR | Status: DC | PRN

## 2019-11-15 MED ORDER — ONDANSETRON HCL 4 MG/2ML IJ SOLN
4.0000 mg | Freq: Four times a day (QID) | INTRAMUSCULAR | Status: DC | PRN
  Administered 2019-11-16: 4 mg via INTRAVENOUS
  Filled 2019-11-15: qty 2

## 2019-11-15 MED ORDER — ZOLPIDEM TARTRATE 5 MG PO TABS: 5.0000 mg | ORAL_TABLET | Freq: Every evening | ORAL | Status: DC | PRN

## 2019-11-15 MED ORDER — PROCHLORPERAZINE MALEATE 10 MG PO TABS
10.0000 mg | ORAL_TABLET | Freq: Four times a day (QID) | ORAL | Status: DC | PRN
  Filled 2019-11-15: qty 1

## 2019-11-15 MED ORDER — LAMOTRIGINE 100 MG PO TABS
200.0000 mg | ORAL_TABLET | Freq: Every day | ORAL | Status: DC
  Administered 2019-11-16 – 2019-11-17 (×2): 200 mg via ORAL
  Filled 2019-11-15 (×2): qty 2

## 2019-11-15 MED ORDER — DULOXETINE HCL 60 MG PO CPEP
90.0000 mg | ORAL_CAPSULE | Freq: Every day | ORAL | Status: DC
  Administered 2019-11-16 – 2019-11-17 (×2): 90 mg via ORAL
  Filled 2019-11-15 (×2): qty 1

## 2019-11-15 MED ORDER — PROMETHAZINE HCL 25 MG/ML IJ SOLN: 12.5000 mg | Freq: Four times a day (QID) | INTRAMUSCULAR | Status: DC | PRN

## 2019-11-15 MED ORDER — ARIPIPRAZOLE 15 MG PO TABS
30.0000 mg | ORAL_TABLET | Freq: Every day | ORAL | Status: DC
  Administered 2019-11-16 – 2019-11-17 (×2): 30 mg via ORAL
  Filled 2019-11-15 (×2): qty 2

## 2019-11-15 MED ORDER — PIPERACILLIN-TAZOBACTAM 3.375 G IVPB 30 MIN
3.3750 g | Freq: Once | INTRAVENOUS | Status: AC
  Administered 2019-11-15: 3.375 g via INTRAVENOUS
  Filled 2019-11-15: qty 50

## 2019-11-15 MED ORDER — BUSPIRONE HCL 5 MG PO TABS
15.0000 mg | ORAL_TABLET | Freq: Three times a day (TID) | ORAL | Status: DC
  Filled 2019-11-15 (×7): qty 3

## 2019-11-15 MED ORDER — ACETAMINOPHEN 500 MG PO TABS
1000.0000 mg | ORAL_TABLET | Freq: Four times a day (QID) | ORAL | Status: DC
  Administered 2019-11-16 (×3): 1000 mg via ORAL
  Filled 2019-11-15 (×4): qty 2

## 2019-11-15 MED ORDER — PROCHLORPERAZINE EDISYLATE 10 MG/2ML IJ SOLN: 5.0000 mg | Freq: Four times a day (QID) | INTRAMUSCULAR | Status: DC | PRN

## 2019-11-15 MED ORDER — PROMETHAZINE HCL 25 MG/ML IJ SOLN: 12.5000 mg | INTRAMUSCULAR | Status: DC | PRN

## 2019-11-15 MED ORDER — ONDANSETRON 4 MG PO TBDP: 4.0000 mg | ORAL_TABLET | Freq: Four times a day (QID) | ORAL | Status: DC | PRN

## 2019-11-15 MED ORDER — TRAMADOL HCL 50 MG PO TABS: 50.0000 mg | ORAL_TABLET | Freq: Four times a day (QID) | ORAL | Status: DC | PRN

## 2019-11-15 MED ORDER — IBUPROFEN 600 MG PO TABS
600.0000 mg | ORAL_TABLET | Freq: Four times a day (QID) | ORAL | Status: DC | PRN
  Filled 2019-11-15: qty 1

## 2019-11-15 MED ORDER — PREGABALIN 75 MG PO CAPS
300.0000 mg | ORAL_CAPSULE | Freq: Two times a day (BID) | ORAL | Status: DC
  Administered 2019-11-15 – 2019-11-17 (×4): 300 mg via ORAL
  Filled 2019-11-15 (×4): qty 4

## 2019-11-15 MED ORDER — TIAGABINE HCL 4 MG PO TABS
8.0000 mg | ORAL_TABLET | Freq: Every day | ORAL | Status: DC
  Administered 2019-11-16: 8 mg via ORAL
  Filled 2019-11-15 (×3): qty 4

## 2019-11-15 MED ORDER — PROPRANOLOL HCL ER 60 MG PO CP24
60.0000 mg | ORAL_CAPSULE | Freq: Every day | ORAL | Status: DC
  Administered 2019-11-16 – 2019-11-17 (×2): 60 mg via ORAL
  Filled 2019-11-15 (×2): qty 1

## 2019-11-15 MED ORDER — SODIUM CHLORIDE 0.9 % IV SOLN: INTRAVENOUS | Status: DC

## 2019-11-15 NOTE — H&P (Signed)
Reason for Consult: Returning to the hospital after leaving AMA earlier in the day, acute perforated appendicitis Referring Physician: Artis Delay, MD [facility] emergency medicine)  Richard [name] is an 28 y.o. male.  HPI: He was admitted to [facility] on 13 November 2019 with acute perforated appendicitis.  Based upon the location of his abscess, it was not thought feasible to place a drain.  Due to the significant inflammation, it was also thought he would likely require a right colectomy if emergent surgery was performed.  The plan was for him to have IV antibiotics and possibly rescan in a day or 2, to see if there was better access to the abscess.  This afternoon, he decided to leave AMA.  I spoke with him over the phone and encouraged him to return to the hospital.  Apparently, he thought better of his decision and did present to the emergency department this evening to be readmitted for care.  Past Medical History:  Diagnosis Date  . Diabetes mellitus without complication (HCC)    type one    History reviewed. No pertinent surgical history.  No family history on file.  Social History:  reports that he has never smoked. He has never used smokeless tobacco. He reports that he does not drink alcohol or use drugs.  Allergies:  Allergies  Allergen Reactions  . Sulfa Antibiotics Anaphylaxis    Medications: I have reviewed the patient's current medications.  Results for orders placed or performed during the hospital encounter of 11/13/19 (from the past 48 hour(s))  Glucose, capillary     Status: Abnormal   Collection Time: 11/13/19  9:09 PM  Result Value Ref Range   Glucose-Capillary 150 (H) 70 - 99 mg/dL  Respiratory Panel by RT PCR (Flu A&B, Covid) - Nasopharyngeal Swab     Status: None   Collection Time: 11/13/19  9:11 PM   Specimen: Nasopharyngeal Swab  Result Value Ref Range   SARS Coronavirus 2 by RT PCR NEGATIVE NEGATIVE    Comment: (NOTE) SARS-CoV-2 target nucleic acids are NOT  DETECTED. The SARS-CoV-2 RNA is generally detectable in upper respiratoy specimens during the acute phase of infection. The lowest concentration of SARS-CoV-2 viral copies this assay can detect is 131 copies/mL. A negative result does not preclude SARS-Cov-2 infection and should not be used as the sole basis for treatment or other patient management decisions. A negative result may occur with  improper specimen collection/handling, submission of specimen other than nasopharyngeal swab, presence of viral mutation(s) within the areas targeted by this assay, and inadequate number of viral copies (<131 copies/mL). A negative result must be combined with clinical observations, patient history, and epidemiological information. The expected result is Negative. Fact Sheet for Patients:  https://www.moore.com/ Fact Sheet for Healthcare Providers:  https://www.young.biz/ This test is not yet ap proved or cleared by the [place] FDA and  has been authorized for detection and/or diagnosis of SARS-CoV-2 by FDA under an Emergency Use Authorization (EUA). This EUA will remain  in effect (meaning this test can be used) for the duration of the COVID-19 declaration under Section 564(b)(1) of the Act, 21 U.S.C. section 360bbb-3(b)(1), unless the authorization is terminated or revoked sooner.    Influenza A by PCR NEGATIVE NEGATIVE   Influenza B by PCR NEGATIVE NEGATIVE    Comment: (NOTE) The Xpert Xpress SARS-CoV-2/FLU/RSV assay is intended as an aid in  the diagnosis of influenza from Nasopharyngeal swab specimens and  should not be used as a sole basis for treatment.  Nasal washings and  aspirates are unacceptable for Xpert Xpress SARS-CoV-2/FLU/RSV  testing. Fact Sheet for Patients: https://www.moore.com/ Fact Sheet for Healthcare Providers: https://www.young.biz/ This test is not yet approved or cleared by the  [place] FDA and  has been authorized for detection and/or diagnosis of SARS-CoV-2 by  FDA under an Emergency Use Authorization (EUA). This EUA will remain  in effect (meaning this test can be used) for the duration of the  Covid-19 declaration under Section 564(b)(1) of the Act, 21  U.S.C. section 360bbb-3(b)(1), unless the authorization is  terminated or revoked. Performed at [facility], [address] [place], [place] [address]   Glucose, capillary     Status: Abnormal   Collection Time: 11/13/19 11:08 PM  Result Value Ref Range   Glucose-Capillary 62 (L) 70 - 99 mg/dL  Glucose, capillary     Status: None   Collection Time: 11/14/19 12:06 AM  Result Value Ref Range   Glucose-Capillary 79 70 - 99 mg/dL  Hemoglobin U0A     Status: Abnormal   Collection Time: 11/14/19  5:10 AM  Result Value Ref Range   Hgb A1c MFr Bld 7.3 (H) 4.8 - 5.6 %    Comment: (NOTE) Pre diabetes:          5.7%-6.4% Diabetes:              >6.4% Glycemic control for   <7.0% adults with diabetes    Mean Plasma Glucose 162.81 mg/dL    Comment: Performed at [facility], [address] [place], [place] [address]  Protime-INR     Status: None   Collection Time: 11/14/19  5:10 AM  Result Value Ref Range   Prothrombin Time 14.3 11.4 - 15.2 seconds   INR 1.1 0.8 - 1.2    Comment: (NOTE) INR goal varies based on device and disease states. Performed at [facility], [address] [place], [place] [address]   CBC     Status: Abnormal   Collection Time: 11/14/19  5:10 AM  Result Value Ref Range   WBC 10.8 (H) 4.0 - 10.5 K/uL   RBC 3.70 (L) 4.22 - 5.81 MIL/uL   Hemoglobin 11.3 (L) 13.0 - 17.0 g/dL   HCT 78.2 (L) 95.6 - 21.3 %   MCV 88.6 80.0 - 100.0 fL   MCH 30.5 26.0 - 34.0 pg   MCHC 34.5 30.0 - 36.0 g/dL   RDW 08.6 57.8 - 46.9 %   Platelets 230 150 - 400 K/uL   nRBC 0.0 0.0 - 0.2 %    Comment: Performed at [facility], [address] [place], [place]  [address]  Comprehensive metabolic panel     Status: Abnormal   Collection Time: 11/14/19  5:10 AM  Result Value Ref Range   Sodium 137 135 - 145 mmol/L    Comment: RESULTS VERIFIED BY REPEAT TESTING JJB   Potassium 3.3 (L) 3.5 - 5.1 mmol/L   Chloride 102 98 - 111 mmol/L   CO2 26 22 - 32 mmol/L   Glucose, Bld 65 (L) 70 - 99 mg/dL   BUN 13 6 - 20 mg/dL   Creatinine, Ser 8.41 0.61 - 1.24 mg/dL   Calcium 7.7 (L) 8.9 - 10.3 mg/dL   Total Protein 6.6 6.5 - 8.1 g/dL   Albumin 2.8 (L) 3.5 - 5.0 g/dL   AST 34 15 - 41 U/L   ALT 29 0 - 44 U/L   Alkaline Phosphatase 120 38 - 126  U/L   Total Bilirubin 0.7 0.3 - 1.2 mg/dL   GFR calc non Af Amer >60 >60 mL/min   GFR calc Af Amer >60 >60 mL/min   Anion gap 9 5 - 15    Comment: Performed at [facility], [address] [place], [place] [address]  Magnesium     Status: None   Collection Time: 11/14/19  5:10 AM  Result Value Ref Range   Magnesium 1.8 1.7 - 2.4 mg/dL    Comment: Performed at [facility], [address] [place], [place] [address]  APTT     Status: Abnormal   Collection Time: 11/14/19  5:10 AM  Result Value Ref Range   aPTT 43 (H) 24 - 36 seconds    Comment:        IF BASELINE aPTT IS ELEVATED, SUGGEST PATIENT RISK ASSESSMENT BE USED TO DETERMINE APPROPRIATE ANTICOAGULANT THERAPY. Performed at [facility], [address] [place], [place] [address]   Glucose, capillary     Status: Abnormal   Collection Time: 11/14/19  6:05 AM  Result Value Ref Range   Glucose-Capillary 58 (L) 70 - 99 mg/dL  Glucose, capillary     Status: Abnormal   Collection Time: 11/14/19  6:24 AM  Result Value Ref Range   Glucose-Capillary 177 (H) 70 - 99 mg/dL  Glucose, capillary     Status: Abnormal   Collection Time: 11/14/19  7:45 AM  Result Value Ref Range   Glucose-Capillary 144 (H) 70 - 99 mg/dL  Glucose, capillary     Status: Abnormal   Collection Time: 11/14/19 11:20 AM  Result Value Ref Range    Glucose-Capillary 147 (H) 70 - 99 mg/dL  Glucose, capillary     Status: Abnormal   Collection Time: 11/14/19  4:05 PM  Result Value Ref Range   Glucose-Capillary 229 (H) 70 - 99 mg/dL  Glucose, capillary     Status: Abnormal   Collection Time: 11/14/19  7:54 PM  Result Value Ref Range   Glucose-Capillary 222 (H) 70 - 99 mg/dL  Glucose, capillary     Status: Abnormal   Collection Time: 11/15/19 12:38 AM  Result Value Ref Range   Glucose-Capillary 165 (H) 70 - 99 mg/dL  Glucose, capillary     Status: Abnormal   Collection Time: 11/15/19  3:43 AM  Result Value Ref Range   Glucose-Capillary 169 (H) 70 - 99 mg/dL  CBC     Status: Abnormal   Collection Time: 11/15/19  7:50 AM  Result Value Ref Range   WBC 8.9 4.0 - 10.5 K/uL   RBC 3.55 (L) 4.22 - 5.81 MIL/uL   Hemoglobin 10.9 (L) 13.0 - 17.0 g/dL   HCT 32.1 (L) 39.0 - 52.0 %   MCV 90.4 80.0 - 100.0 fL   MCH 30.7 26.0 - 34.0 pg   MCHC 34.0 30.0 - 36.0 g/dL   RDW 12.7 11.5 - 15.5 %   Platelets 270 150 - 400 K/uL   nRBC 0.0 0.0 - 0.2 %    Comment: Performed at [facility], [address] [place], [place] [address]  Glucose, capillary     Status: Abnormal   Collection Time: 11/15/19  7:55 AM  Result Value Ref Range   Glucose-Capillary 173 (H) 70 - 99 mg/dL  Glucose, capillary     Status: Abnormal   Collection Time: 11/15/19 11:49 AM  Result Value Ref Range   Glucose-Capillary 173 (H) 70 - 99 mg/dL    CT  ABDOMEN PELVIS W CONTRAST  Result Date: 11/13/2019 CLINICAL DATA:  RIGHT lower quadrant pain, RIGHT flank pain, nausea, and vomiting for 3 days, foul smelling dark brown urine, fever, history diabetes mellitus, question appendicitis EXAM: CT ABDOMEN AND PELVIS WITH CONTRAST TECHNIQUE: Multidetector CT imaging of the abdomen and pelvis was performed using the standard protocol following bolus administration of intravenous contrast. Sagittal and coronal MPR images reconstructed from axial data set. CONTRAST:  100mL  OMNIPAQUE IOHEXOL 300 MG/ML SOLN IV. No oral contrast. COMPARISON:  None FINDINGS: Lower chest: Lung bases clear Hepatobiliary: Tiny calcified gallstones in gallbladder. Gallbladder and liver otherwise normal appearance. No biliary dilatation. Pancreas: Normal appearance Spleen: Normal appearance. 2.0 cm diameter splenule inferior to splenic hilum. Adrenals/Urinary Tract: Adrenal glands normal appearance. Mildly patchy nephrogram at mid RIGHT kidney, cannot exclude completely exclude pyelonephritis. No renal mass or hydronephrosis. No ureteral calcification or dilatation. Stomach/Bowel: Stomach unremarkable. Small bowel loops normal appearance. Inflammatory process centered at appendix consistent with acute appendicitis: Appendix: Location: Medial to cecum Diameter: 21 mm Appendicolith: Not identified Mucosal hyper-enhancement: Present Extraluminal gas: Present Periappendiceal collection: Present, adjacent to medial wall of cecum, appendix and terminal ileum, approximately 4.2 x 4.0 x 4.0 cm in size, containing gas and fluid. Vascular/Lymphatic: Vascular structures unremarkable. Multiple normal sized mesenteric lymph nodes cranial to the inflammatory process in the RIGHT lower quadrant. Reproductive: Unremarkable prostate gland and seminal vesicles Other: No free air or free fluid. Tiny umbilical hernia containing fat. Musculoskeletal: Unremarkable IMPRESSION: Perforated acute appendicitis with a 4.0 x 4.0 x 4.2 cm diameter gas and fluid collection adjacent to the appendix, medial cecum, and terminal ileum. Findings called to Dr. Derrill KayGoodman on 11/13/2019 at 2051 hours. Electronically Signed   By: Ulyses SouthwardMark  Boles M.D.   On: 11/13/2019 20:52    Review of Systems  Constitutional: Positive for fever.  Gastrointestinal: Positive for abdominal pain.  Psychiatric/Behavioral: The patient is nervous/anxious.   All other systems reviewed and are negative.  Blood pressure 131/74, pulse 99, temperature 98.8 F (37.1 C),  temperature source Oral, resp. rate 16, weight 79.8 kg, SpO2 96 %. Physical Exam  Constitutional: He is oriented to person, place, and time. He appears well-developed and well-nourished. No distress.  HENT:  Head: Normocephalic and atraumatic.  Mouth and nose are covered with a mask secondary to COVID-19 precautions  Eyes: Right eye exhibits no discharge. Left eye exhibits no discharge. No scleral icterus.  Neck: No tracheal deviation present. No thyromegaly present.  Cardiovascular: Normal rate and regular rhythm.  Respiratory: Effort normal. No stridor. No respiratory distress.  GI: Soft.  Tender to palpation in the right lower quadrant.  No rebound, guarding, or other peritoneal signs present.  Genitourinary:    Genitourinary Comments: Deferred   Musculoskeletal:        General: No deformity or edema.  Neurological: He is alert and oriented to person, place, and time.  Skin: Skin is warm and dry.  Psychiatric: He has a normal mood and affect.    Assessment/Plan: Is a 28 year old young man who was admitted to the hospital several days ago with perforated appendicitis.  He elected to leave AGAINST MEDICAL ADVICE earlier today, but thought better of his decision and is now here to be readmitted for care. -Admit to 2C -N.p.o. after midnight except for meds -We will make sure patient has all of his psychiatric medications prescribed -IV fluids with IV antibiotics -Insulin coverage as indicated   Duanne GuessJennifer Iasiah Ozment 11/15/2019, 7:32 PM

## 2019-11-15 NOTE — Telephone Encounter (Signed)
I was notified by [facility] nurse, Chuck Hint, that he had elected to leave AMA.  Unfortunately, during the time that he and Cloyde Reams were discussing this, I was in the operating room performing surgery on another patient.  By the time I was able to respond to [facility] chat messages, the patient had already left and signed AMA papers.  I called the patient and discussed with him the reasons that he felt he needed to leave.  He said that much of his need to get out of the hospital was related to his mental state.  He was also concerned that his diabetes was not being taken seriously, as the clear liquid diet was full of sugary options.  I asked if there was any possibility he would like to come back so that we could provide appropriate care for him.  He stated that he felt that he would do fine at home, but that he would return to the hospital if he felt he were getting worse.  I expressed my concern that he could potentially become septic while at home, as he did not complete an appropriate course of antibiotics for his perforated appendicitis.  He stated that he was sorry that he had left without talking to me and that he had "caused me all this trouble," but that he would not come back unless he got worse.  Dr. Dahlia Byes was notified via text message.

## 2019-11-15 NOTE — Progress Notes (Signed)
Called Dr. Peyton Najjar regarding patient complaints of intense pain and nausea. Doctor ordered a one time dose of 30mg  of toradol. Surgery will see patient in a few hours.  Will continue to monitor.  Christene Slates  11/15/2019  5:28 AM

## 2019-11-15 NOTE — H&P (Signed)
[facility] SURGICAL PROGRESS NOTE  HISTORY OF PRESENT ILLNESS (HPI):  28 y.o. male presented to [facility] ED Tuesda evening for for abdominal pain. Patient reported a 3 days history of abdominal. At the onset, this was a generalized aching pain. However over the next few days this worsened, became sharp, and localized to his RLQ. Nothing made the pain better. He did endorse associated fever at home to 100F. No chills, cough, congestion, CP, SOB, nausea, or emesis. No history of similar pain in the past. No previous abdominal surgeries. Work up in the Ed was concerning for mild leukocytosis, slight AKI, and ppendicitis with abscess on CT.   The abscess was not amenable to percutaneous drainage.  Due to the degree of inflammation, it was felt that he would likely require a partial colectomy in addition to appendectomy, should urgent intervention be undertaken.  He was therefore admitted to try and manage the acute appendicitis conservatively, with the hopes that an interval appendectomy could be performed in the future.  Overnight, he complained of poor pain control, as well as some nausea.  He did receive an additional dose of 30 mg of ketorolac, with limited effect.  This morning, he states that he is still somewhat nauseated and wants to know why the abscess cannot be drained.  This was explained to him and he seemed to understand the rationale.  PAST MEDICAL HISTORY (PMH):  Past Medical History:  Diagnosis Date  . Diabetes mellitus without complication (HCC)    type one    Reviewed. Otherwise negative.   PAST SURGICAL HISTORY (PSH):  History reviewed. No pertinent surgical history.  Reviewed. Otherwise negative.   MEDICATIONS:  Prior to Admission medications   Medication Sig Start Date End Date Taking? Authorizing Provider  ARIPiprazole (ABILIFY) 30 MG tablet Take 30 mg by mouth daily. 10/29/19  Yes [provider]  busPIRone (BUSPAR) 15 MG tablet Take 15 mg by mouth  3 (three) times daily. 10/29/19  Yes [provider]  DULoxetine (CYMBALTA) 30 MG capsule Take 90 mg by mouth daily. 10/25/19  Yes [provider]  HUMALOG 100 UNIT/ML injection ON TANDEM PUMP WILL USE GREATER THAN 250 UNITS PER 3 DAYS OVER 2 500 UNITS PER MONTH (WILL REPLACE APIDRA ONCE ON INSULIN PUMP) 11/07/19  Yes [provider]  Insulin Glargine (LANTUS) 100 UNIT/ML Solostar Pen Off-pump: Lantus/Levemir or Basaglar pen: 22 u as soon as off pump/ea.24 hrs off pump. Restart pump after 20 hrs from last longacting shot. 04/30/19  Yes [provider]  lamoTRIgine (LAMICTAL) 100 MG tablet Take 200 mg by mouth daily. 10/29/19  Yes [provider]  pregabalin (LYRICA) 300 MG capsule Take 300 mg by mouth 2 (two) times daily. 11/07/19  Yes [provider]  propranolol ER (INDERAL LA) 60 MG 24 hr capsule Take 60 mg by mouth daily. 11/07/19  Yes [provider]  tiaGABine (GABITRIL) 4 MG tablet Take 8 mg by mouth at bedtime. 10/30/19  Yes [provider]  traMADol (ULTRAM) 50 MG tablet Take 50 mg by mouth 2 (two) times daily as needed. 10/26/19  Yes [provider]  zolpidem (AMBIEN) 10 MG tablet Take 10 mg by mouth at bedtime as needed. 07/10/19  Yes [provider]     ALLERGIES:  Allergies  Allergen Reactions  . Sulfa Antibiotics Anaphylaxis     SOCIAL HISTORY:  Social History   Socioeconomic History  . Marital status: Married    Spouse name: Not on file  .  Number of children: Not on file  . Years of education: Not on file  . Highest education level: Not on file  Occupational History  . Not on file  Tobacco Use  . Smoking status: Never Smoker  . Smokeless tobacco: Never Used  Substance and Sexual Activity  . Alcohol use: Never  . Drug use: Never  . Sexual activity: Not on file  Other Topics Concern  . Not on file  Social History Narrative  . Not on file   Social Determinants of Health   Financial  Resource Strain:   . Difficulty of Paying Living Expenses: Not on file  Food Insecurity:   . Worried About Programme researcher, broadcasting/film/videounning Out of Food in the Last Year: Not on file  . Ran Out of Food in the Last Year: Not on file  Transportation Needs:   . Lack of Transportation (Medical): Not on file  . Lack of Transportation (Non-Medical): Not on file  Physical Activity:   . Days of Exercise per Week: Not on file  . Minutes of Exercise per Session: Not on file  Stress:   . Feeling of Stress : Not on file  Social Connections:   . Frequency of Communication with Friends and Family: Not on file  . Frequency of Social Gatherings with Friends and Family: Not on file  . Attends Religious Services: Not on file  . Active Member of Clubs or Organizations: Not on file  . Attends BankerClub or Organization Meetings: Not on file  . Marital Status: Not on file  Intimate Partner Violence:   . Fear of Current or Ex-Partner: Not on file  . Emotionally Abused: Not on file  . Physically Abused: Not on file  . Sexually Abused: Not on file     FAMILY HISTORY:  History reviewed. No pertinent family history.  Otherwise negative.   REVIEW OF SYSTEMS:  Review of Systems  Constitutional: Positive for fever. Negative for chills.  HENT: Negative for congestion and sore throat.   Respiratory: Negative for cough and shortness of breath.   Cardiovascular: Negative for chest pain and palpitations.  Gastrointestinal: Positive for abdominal pain. Negative for blood in stool, constipation, diarrhea, nausea and vomiting.  Genitourinary: Negative for dysuria and urgency.  All other systems reviewed and are negative.   VITAL SIGNS:  Temp:  [98 F (36.7 C)-98.3 F (36.8 C)] 98.3 F (36.8 C) (12/24 0504) Pulse Rate:  [105-106] 105 (12/24 0504) Resp:  [20] 20 (12/24 0504) BP: (115-133)/(66-76) 133/76 (12/24 0504) SpO2:  [96 %-98 %] 96 % (12/24 0504)     Height: 5\' 1"  (154.9 cm) Weight: 79.8 kg BMI (Calculated): 33.27   PHYSICAL  EXAM:  Physical Exam Vitals and nursing note reviewed.  Constitutional:      Appearance: Normal appearance. He is obese.  Eyes:     General: No scleral icterus.    Conjunctiva/sclera: Conjunctivae normal.  Cardiovascular:     Rate and Rhythm: Normal rate and regular rhythm.     Pulses: Normal pulses.     Heart sounds: Normal heart sounds. No murmur. No friction rub. No gallop.   Pulmonary:     Effort: Pulmonary effort is normal. No respiratory distress.     Breath sounds: Normal breath sounds. No stridor. No wheezing.  Abdominal:     General: Abdomen is protuberant. There is no distension.     Palpations: Abdomen is soft.     Tenderness: There is abdominal tenderness in the right lower quadrant. There is no guarding or  rebound. Positive signs include McBurney's sign. Negative signs include Rovsing's sign.  Genitourinary:    Comments: Deferred Musculoskeletal:     Right lower leg: No edema.     Left lower leg: No edema.  Skin:    General: Skin is warm and dry.     Coloration: Skin is not pale.     Findings: No erythema.  Neurological:     General: No focal deficit present.     Mental Status: He is alert and oriented to person, place, and time.  Psychiatric:        Mood and Affect: Mood normal.        Behavior: Behavior normal.     INTAKE/OUTPUT:  This shift: No intake/output data recorded.  Last 2 shifts: @IOLAST2SHIFTS @  Labs:  CBC Latest Ref Rng & Units 11/15/2019 11/14/2019 11/13/2019  WBC 4.0 - 10.5 K/uL 8.9 10.8(H) 10.7(H)  Hemoglobin 13.0 - 17.0 g/dL 10.9(L) 11.3(L) 12.3(L)  Hematocrit 39.0 - 52.0 % 32.1(L) 32.8(L) 36.2(L)  Platelets 150 - 400 K/uL 270 230 251   CMP Latest Ref Rng & Units 11/14/2019 11/13/2019  Glucose 70 - 99 mg/dL 65(L) 255(H)  BUN 6 - 20 mg/dL 13 17  Creatinine 0.61 - 1.24 mg/dL 1.23 1.49(H)  Sodium 135 - 145 mmol/L 137 132(L)  Potassium 3.5 - 5.1 mmol/L 3.3(L) 3.5  Chloride 98 - 111 mmol/L 102 94(L)  CO2 22 - 32 mmol/L 26 26  Calcium  8.9 - 10.3 mg/dL 7.7(L) 8.6(L)  Total Protein 6.5 - 8.1 g/dL 6.6 7.7  Total Bilirubin 0.3 - 1.2 mg/dL 0.7 0.7  Alkaline Phos 38 - 126 U/L 120 145(H)  AST 15 - 41 U/L 34 25  ALT 0 - 44 U/L 29 26     Imaging studies:   CT Abdomen/Pelvis (11/13/2019) personally reviewed with inflammatory changes surrounding the appendix with abscess, and radiologist report reviewed:  IMPRESSION: Perforated acute appendicitis with a 4.0 x 4.0 x 4.2 cm diameter gas and fluid collection adjacent to the appendix, medial cecum, and terminal ileum.    Assessment/Plan: (ICD-10's: K35.33) 28 y.o. male with leukocytosis and abdominal pain attributed to complicated appendicitis with abscess, complicated by pertinent comorbidities including T1DM.  His leukocytosis has improved, and he has been afebrile.   -Will permit sips of clears today, but make him n.p.o. after midnight, in case operative intervention is required.  -Continue IV ABx (Zosyn)  - Attempt conservative management; plan for interval appendectomy. He understands if this were to fail or he clinically deteriorates then he will require more urgent intervention.    -We will adjust pain regimen to try and provide better comfort, as well as optimize antiemetic regimen.  - monitor leukocytosis   - medical management of comorbid conditions; home insulin     All of the above findings and recommendations were discussed with the patient, and all of his questions were answered to his expressed satisfaction.

## 2019-11-15 NOTE — ED Provider Notes (Signed)
[facility] Emergency Department Provider Note  ____________________________________________   First MD Initiated Contact with Patient 11/15/19 1816     (approximate)  I have reviewed the triage vital signs and the nursing notes.   HISTORY  Chief Complaint Abdominal Pain    HPI Richard [name] is a 28 y.o. male with diabetes who was recently admitted for an acute appendicitis with perforation abscess on 12/22 who comes in with worsening pain.  The abscess was not amenable to percutaneous drain.  Due to the degree of inflammation was felt that he would likely require a partial colectomy in addition to appendectomy.  Patient eventually left AMA due to saying that his Medicare and insurance was going to start January 1 and that he would prefer to go home and left AMA.  Patient states that he realizes now that he was being stupid and that he needs to be on antibiotics.  He states that his pain is actually doing pretty well.  He said he had a fever earlier but none currently.  He has not had any vomiting.  His pain is mild, right lower quadrant, nothing makes better, nothing makes it worse.  He is willing to be admitted for the IV antibiotics or if they feel it is appropriate go home with oral antibiotics but he wants to make sure that he is adequately treated for this.           Past Medical History:  Diagnosis Date  . Diabetes mellitus without complication (HCC)    type one    Patient Active Problem List   Diagnosis Date Noted  . Appendicitis with abscess 11/13/2019    History reviewed. No pertinent surgical history.  Prior to Admission medications   Medication Sig Start Date End Date Taking? Authorizing Provider  ARIPiprazole (ABILIFY) 30 MG tablet Take 30 mg by mouth daily. 10/29/19   [provider]  busPIRone (BUSPAR) 15 MG tablet Take 15 mg by mouth 3 (three) times daily. 10/29/19   [provider]  DULoxetine (CYMBALTA) 30 MG  capsule Take 90 mg by mouth daily. 10/25/19   [provider]  HUMALOG 100 UNIT/ML injection ON TANDEM PUMP WILL USE GREATER THAN 250 UNITS PER 3 DAYS OVER 2 500 UNITS PER MONTH (WILL REPLACE APIDRA ONCE ON INSULIN PUMP) 11/07/19   [provider]  Insulin Glargine (LANTUS) 100 UNIT/ML Solostar Pen Off-pump: Lantus/Levemir or Basaglar pen: 22 u as soon as off pump/ea.24 hrs off pump. Restart pump after 20 hrs from last longacting shot. 04/30/19   [provider]  lamoTRIgine (LAMICTAL) 100 MG tablet Take 200 mg by mouth daily. 10/29/19   [provider]  pregabalin (LYRICA) 300 MG capsule Take 300 mg by mouth 2 (two) times daily. 11/07/19   [provider]  propranolol ER (INDERAL LA) 60 MG 24 hr capsule Take 60 mg by mouth daily. 11/07/19   [provider]  tiaGABine (GABITRIL) 4 MG tablet Take 8 mg by mouth at bedtime. 10/30/19   [provider]  traMADol (ULTRAM) 50 MG tablet Take 50 mg by mouth 2 (two) times daily as needed. 10/26/19   [provider]  zolpidem (AMBIEN) 10 MG tablet Take 10 mg by mouth at bedtime as needed. 07/10/19   [provider]    Allergies Sulfa antibiotics  No family history on file.  Social History Social History   Tobacco Use  . Smoking status: Never Smoker  . Smokeless tobacco: Never Used  Substance  Use Topics  . Alcohol use: Never  . Drug use: Never      Review of Systems Constitutional: Fevers Eyes: No visual changes. ENT: No sore throat. Cardiovascular: Denies chest pain. Respiratory: Denies shortness of breath. Gastrointestinal: Abdominal pain no nausea, no vomiting.  No diarrhea.  No constipation. Genitourinary: Negative for dysuria. Musculoskeletal: Negative for back pain. Skin: Negative for rash. Neurological: Negative for headaches, focal weakness or numbness. All other ROS negative ____________________________________________   PHYSICAL EXAM:  VITAL  SIGNS: ED Triage Vitals  Enc Vitals Group     BP 11/15/19 1716 131/74     Pulse Rate 11/15/19 1716 99     Resp 11/15/19 1716 16     Temp 11/15/19 1716 98.8 F (37.1 C)     Temp Source 11/15/19 1716 Oral     SpO2 11/15/19 1716 96 %     Weight 11/15/19 1715 175 lb 14.8 oz (79.8 kg)     Height --      Head Circumference --      Peak Flow --      Pain Score 11/15/19 1715 9     Pain Loc --      Pain Edu? --      Excl. in [place]? --     Constitutional: Alert and oriented. Well appearing and in no acute distress. Eyes: Conjunctivae are normal. EOMI. Head: Atraumatic. Nose: No congestion/rhinnorhea. Mouth/Throat: Mucous membranes are moist.   Neck: No stridor. Trachea Midline. FROM Cardiovascular: Normal rate, regular rhythm. Grossly normal heart sounds.  Good peripheral circulation. Respiratory: Normal respiratory effort.  No retractions. Lungs CTAB. Gastrointestinal: Soft slight tenderness but no rebound or guarding.  No distention. No abdominal bruits.  Musculoskeletal: No lower extremity tenderness nor edema.  No joint effusions. Neurologic:  Normal speech and language. No gross focal neurologic deficits are appreciated.  Skin:  Skin is warm, dry and intact. No rash noted. Psychiatric: Mood and affect are normal. Speech and behavior are normal. GU: Deferred   ____________________________________________   LABS (all labs ordered are listed, but only abnormal results are displayed)  Labs Reviewed  HIV ANTIBODY (ROUTINE TESTING W REFLEX)   ____________________________________________   INITIAL IMPRESSION / ASSESSMENT AND PLAN / ED COURSE  Richard [name] was evaluated in Emergency Department on 11/15/2019 for the symptoms described in the history of present illness. He was evaluated in the context of the global COVID-19 pandemic, which necessitated consideration that the patient might be at risk for infection with the SARS-CoV-2 virus that causes COVID-19. Institutional  protocols and algorithms that pertain to the evaluation of patients at risk for COVID-19 are in a state of rapid change based on information released by regulatory bodies including the CDC and federal and state organizations. These policies and algorithms were followed during the patient's care in the ED.    Patient is a 28 year old with known appendicitis with perforation that was not amenable to drainage.  Patient looks well-appearing does not appear bacteremic or septic.  Patient does have some minimal right lower quadrant tenderness consistent with his known diagnosis.  Lower suspicion for SBO, diverticulitis given the above.  Will discuss with the surgery team to see what they would like to do.  Discussed with Dr. Celine Ahr who recommended dose of IV Zosyn and readmission.   ____________________________________________   FINAL CLINICAL IMPRESSION(S) / ED DIAGNOSES   Final diagnoses:  Perforated appendicitis      MEDICATIONS GIVEN DURING THIS VISIT:  Medications  piperacillin-tazobactam (ZOSYN) IVPB 3.375 g (3.375  g Intravenous New Bag/Given 11/15/19 1902)  piperacillin-tazobactam (ZOSYN) IVPB 3.375 g (has no administration in time range)  0.9 %  sodium chloride infusion (has no administration in time range)  acetaminophen (TYLENOL) tablet 1,000 mg (has no administration in time range)  ketorolac (TORADOL) 30 MG/ML injection 30 mg (has no administration in time range)  ibuprofen (ADVIL) tablet 600 mg (has no administration in time range)  HYDROmorphone (DILAUDID) injection 0.5 mg (has no administration in time range)  ondansetron (ZOFRAN-ODT) disintegrating tablet 4 mg (has no administration in time range)    Or  ondansetron (ZOFRAN) injection 4 mg (has no administration in time range)  prochlorperazine (COMPAZINE) tablet 10 mg (has no administration in time range)    Or  prochlorperazine (COMPAZINE) injection 5-10 mg (has no administration in time range)  promethazine (PHENERGAN)  injection 12.5 mg (has no administration in time range)     ED Discharge Orders    None       Note:  This document was prepared using Dragon voice recognition software and may include unintentional dictation errors.   Concha Se, MD 11/15/19 1929

## 2019-11-15 NOTE — ED Notes (Signed)
Spoke to [place], MD regarding covid swab. States that she and surgeon are both okay with pt having new covid swab because he was just tested with a negative result on the 22nd. Charge nurse on [place] aware.

## 2019-11-15 NOTE — Progress Notes (Signed)
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 He stated he would rather come in outpatient to have appendectomy done. Patient states he's ready to go home and if he feels bad he will come back again. Patient said his Medicare insurance starts November 23, 2019 and would like to hold off until after his insurance starts. Dr. Celine Ahr made aware. Patient signed AMA form and placed in chart. Writer took IV out and patient left with husband in private vehicle.

## 2019-11-15 NOTE — ED Triage Notes (Signed)
Left AMA.  Returns with c/o abdominal pain

## 2019-11-15 NOTE — ED Notes (Signed)
Orders to d/c subcutaneous insulin coverage d/t pt having insulin pump.

## 2019-11-16 ENCOUNTER — Encounter: Payer: Self-pay | Admitting: General Surgery

## 2019-11-16 ENCOUNTER — Inpatient Hospital Stay: Payer: PRIVATE HEALTH INSURANCE

## 2019-11-16 DIAGNOSIS — K3532 Acute appendicitis with perforation and localized peritonitis, without abscess: Secondary | ICD-10-CM

## 2019-11-16 LAB — BASIC METABOLIC PANEL
Anion gap: 10 (ref 5–15)
BUN: 8 mg/dL (ref 6–20)
CO2: 25 mmol/L (ref 22–32)
Calcium: 8.4 mg/dL — ABNORMAL LOW (ref 8.9–10.3)
Chloride: 104 mmol/L (ref 98–111)
Creatinine, Ser: 1.06 mg/dL (ref 0.61–1.24)
GFR calc Af Amer: >60 mL/min (ref >60)
GFR calc non Af Amer: >60 mL/min (ref >60)
Glucose, Bld: 152 mg/dL — ABNORMAL HIGH (ref 70–99)
Potassium: 3.8 mmol/L (ref 3.5–5.1)
Sodium: 139 mmol/L (ref 135–145)

## 2019-11-16 LAB — CBC
HCT: 33.8 % — ABNORMAL LOW (ref 39.0–52.0)
Hemoglobin: 11.6 g/dL — ABNORMAL LOW (ref 13.0–17.0)
MCH: 30.5 pg (ref 26.0–34.0)
MCHC: 34.3 g/dL (ref 30.0–36.0)
MCV: 88.9 fL (ref 80.0–100.0)
Platelets: 287 10*3/uL (ref 150–400)
RBC: 3.8 MIL/uL — ABNORMAL LOW (ref 4.22–5.81)
RDW: 13.1 % (ref 11.5–15.5)
WBC: 7.2 10*3/uL (ref 4.0–10.5)
nRBC: 0 % (ref 0.0–0.2)

## 2019-11-16 LAB — GLUCOSE, CAPILLARY
Glucose-Capillary: 130 mg/dL — ABNORMAL HIGH (ref 70–99)
Glucose-Capillary: 135 mg/dL — ABNORMAL HIGH (ref 70–99)
Glucose-Capillary: 123 mg/dL — ABNORMAL HIGH (ref 70–99)
Glucose-Capillary: 105 mg/dL — ABNORMAL HIGH (ref 70–99)
Glucose-Capillary: 153 mg/dL — ABNORMAL HIGH (ref 70–99)

## 2019-11-16 LAB — HIV ANTIBODY (ROUTINE TESTING W REFLEX): HIV Screen 4th Generation wRfx: NONREACTIVE

## 2019-11-16 LAB — MAGNESIUM: Magnesium: 1.9 mg/dL (ref 1.7–2.4)

## 2019-11-16 LAB — PHOSPHORUS: Phosphorus: 2.9 mg/dL (ref 2.5–4.6)

## 2019-11-16 MED ORDER — SODIUM CHLORIDE 0.9 % IV SOLN
INTRAVENOUS | Status: DC | PRN
  Administered 2019-11-16: 250 mL via INTRAVENOUS

## 2019-11-16 MED ORDER — IOHEXOL 9 MG/ML PO SOLN
1000.0000 mL | Freq: Once | ORAL | Status: AC | PRN
  Administered 2019-11-16: 1000 mL via ORAL

## 2019-11-16 MED ORDER — IOHEXOL 300 MG/ML  SOLN
100.0000 mL | Freq: Once | INTRAMUSCULAR | Status: AC | PRN
  Administered 2019-11-16: 100 mL via INTRAVENOUS

## 2019-11-16 NOTE — Progress Notes (Signed)
Dr. Dahlia Byes asked me to tell patient that his CT scan showed improvement, no need for a drain. He can advance his diet as tolerated starting with clears, possible dc in 24-48 hours. He needs the antibiotics still. I talked with the patient about this.

## 2019-11-16 NOTE — Progress Notes (Signed)
CC: Appendicitis Subjective: Patient doing well feeling well.  He was nauseated after getting Gastrografin.  He denies any fevers any chills.  He denies any abdominal pain.  Passing flatus.  He does understand that going [place] was not a good decision.  He is apologetic and appreciative WBc nml  Objective: Vital signs in last 24 hours: Temp:  [97.7 F (36.5 C)-98.8 F (37.1 C)] 97.7 F (36.5 C) (12/25 0429) Pulse Rate:  [98-114] 104 (12/25 0916) Resp:  [16-20] 20 (12/25 0429) BP: (127-156)/(74-97) 156/96 (12/25 0916) SpO2:  [96 %-99 %] 96 % (12/25 0429) Weight:  [79.8 kg-86 kg] 86 kg (12/24 2151) Last BM Date: 11/15/19  Intake/Output from previous day: 12/24 0701 - 12/25 0700 In: 240 [P.O.:240] Out: 300 [Urine:300] Intake/Output this shift: No intake/output data recorded.  Physical exam:  NAD, obese Abd: soft, Non tender, no peritonitis Ext: well perfused and warm  Lab Results: CBC  Recent Labs    11/15/19 0750 11/16/19 0457  WBC 8.9 7.2  HGB 10.9* 11.6*  HCT 32.1* 33.8*  PLT 270 287   BMET Recent Labs    11/14/19 0510 11/16/19 0457  NA 137 139  K 3.3* 3.8  CL 102 104  CO2 26 25  GLUCOSE 65* 152*  BUN 13 8  CREATININE 1.23 1.06  CALCIUM 7.7* 8.4*   PT/INR Recent Labs    11/14/19 0510  LABPROT 14.3  INR 1.1   ABG No results for input(s): PHART, HCO3 in the last 72 hours.  Invalid input(s): PCO2, PO2  Studies/Results: CT ABDOMEN PELVIS W CONTRAST  Result Date: 11/16/2019 CLINICAL DATA:  28 year old admitted with acute perforated appendicitis on 11/13/2019. The patient is undergoing antibiotic therapy in the hopes of reducing inflammation and allowing for less extensive surgery. EXAM: CT ABDOMEN AND PELVIS WITH CONTRAST TECHNIQUE: Multidetector CT imaging of the abdomen and pelvis was performed using the standard protocol following bolus administration of intravenous contrast. CONTRAST:  158mL OMNIPAQUE IOHEXOL 300 MG/ML IV.  COMPARISON:  11/13/2019. FINDINGS: Lower chest: Heart size normal.  Visualized lung bases clear. Hepatobiliary: Liver normal in size and appearance. At least 2 small gallstones in the gallbladder, the largest measuring approximately 5 mm. No pericholecystic edema or inflammation. No biliary ductal dilation. Pancreas: Normal in appearance without evidence of mass, ductal dilation, or inflammation. Spleen: Normal in size and appearance. Focus of accessory splenic tissue medial to the lower pole again noted. Adrenals/Urinary Tract: Normal appearing adrenal glands. Kidneys normal in size and appearance without focal parenchymal abnormality. No hydronephrosis. No evidence of urinary tract calculi. Normal appearing urinary bladder. Stomach/Bowel: Stomach normal in appearance for the degree of distention. Normal-appearing small bowel. Mucosal enhancement involving the cecum with pericecal edema/inflammation secondary to the known perforated appendicitis. Remainder of the colon decompressed and normal in appearance. Persistent appendiceal dilation up to approximately 20 mm. Persistent extraluminal gas and periappendiceal abscess which currently measures approximately 3.1 x 3.3 x 3.5 cm, decreased in size from the CT 2 days ago. No free intraperitoneal air elsewhere, indicating this is a contained perforation. Edema/inflammation in the periappendiceal fat, unchanged. No new abscess. Vascular/Lymphatic: No visible aortoiliofemoral atherosclerosis. Widely patent visceral arteries. Normal-appearing portal venous and systemic venous systems. No pathologic lymphadenopathy. Reproductive: Prostate gland and seminal vesicles normal in size and appearance for age. Other: None. Musculoskeletal: Regional skeleton unremarkable without acute or significant osseous abnormality. IMPRESSION: 1. Interval decrease in size of the periappendiceal abscess since the CT 3 days ago, currently measuring approximately 3.1 x 3.3 x  3.5 cm. Stable  periappendiceal and pericecal inflammation and stable secondary inflammation of the cecum. 2. No free intraperitoneal air elsewhere elsewhere in the abdomen or pelvis, indicating this is a contained perforation. 3. Cholelithiasis without CT evidence of acute cholecystitis. Electronically Signed   By: Hulan Saas M.D.   On: 11/16/2019 11:17    Anti-infectives: Anti-infectives (From admission, onward)   Start     Dose/Rate Route Frequency Ordered Stop   11/16/19 0600  piperacillin-tazobactam (ZOSYN) IVPB 3.375 g     3.375 g 12.5 mL/hr over 240 Minutes Intravenous Every 8 hours 11/15/19 1928     11/15/19 1900  piperacillin-tazobactam (ZOSYN) IVPB 3.375 g     3.375 g 100 mL/hr over 30 Minutes Intravenous  Once 11/15/19 1849 11/15/19 1932      Assessment/Plan: Appendicitis with abscess.  Repeat CT scan personally reviewed and there is evidence of significant improvement from the abscess perspective.  He is also clinically improving.  We will advance diet today slowly.  DC home in 24 to 48 hours depending on clinical condition with 10-day antibiotic course No need for emergent surgical intervention at this time Please note that I spent 35 minutes in this encounter with greater than 50% spent in coordination and counseling of his care  Sterling Big, MD, [facility]  11/16/2019

## 2019-11-16 NOTE — Progress Notes (Signed)
Notified Dr. Dahlia Byes that the patient did well with clear diet. We are advancing to full liquid. Also notified Dr. Dahlia Byes that the patient has some possible busted blood vessels in his eyes. Patient stated he thought it was from his vomiting.

## 2019-11-16 NOTE — Progress Notes (Signed)
Patient reports self administration of insulin via his insulin pump based on the finger stick results obtained by the staff. No other issues noted.  Will continue to monitor.

## 2019-11-17 LAB — GLUCOSE, CAPILLARY
Glucose-Capillary: 100 mg/dL — ABNORMAL HIGH (ref 70–99)
Glucose-Capillary: 179 mg/dL — ABNORMAL HIGH (ref 70–99)
Glucose-Capillary: 51 mg/dL — ABNORMAL LOW (ref 70–99)
Glucose-Capillary: 66 mg/dL — ABNORMAL LOW (ref 70–99)
Glucose-Capillary: 84 mg/dL (ref 70–99)
Glucose-Capillary: 94 mg/dL (ref 70–99)

## 2019-11-17 MED ORDER — AMOXICILLIN-POT CLAVULANATE 875-125 MG PO TABS: 1.0000 | ORAL_TABLET | Freq: Two times a day (BID) | ORAL | 0 refills | Status: AC

## 2019-11-17 NOTE — Discharge Summary (Signed)
  Patient ID: Richard [name] MRN: [id number] DOB/AGE: May 21, 1991 27 y.o.  Admit date: 11/15/2019 Discharge date: 11/17/2019   Discharge Diagnoses:  Active Problems:   Acute perforated appendicitis   Procedures: None  Hospital Course: Patient was admitted with perforated appendicitis with phlegmon process on November 13, 2019.  Initially area measured 4 cm.  It was not amenable for percutaneous drainage.  Patient was treated with IV antibiotic and bowel rest.  The patient started to improve clinically.  Yesterday the CT scan was repeated and shows an interval decrease of the periappendiceal abscess consistent with adequate response to current IV antibiotic therapy.  Today the patient is completely out of pain.  He does not have any fever.  He tolerated a soft diet.  He has been ambulating.  Physical Exam  Constitutional: He is oriented to person, place, and time and well-developed, well-nourished, and in no distress.  Cardiovascular: Normal rate.  Pulmonary/Chest: Effort normal.  Abdominal: Soft. He exhibits no distension. There is no abdominal tenderness. There is no rebound.  Neurological: He is alert and oriented to person, place, and time.  Skin: Skin is warm.   Consults: None  Disposition: Discharge disposition: 01-Home or Self Care        Allergies as of 11/17/2019      Reactions   Sulfa Antibiotics Anaphylaxis      Medication List    TAKE these medications   amoxicillin-clavulanate 875-125 MG tablet Commonly known as: Augmentin Take 1 tablet by mouth 2 (two) times daily for 10 days.   ARIPiprazole 30 MG tablet Commonly known as: ABILIFY Take 30 mg by mouth daily.   busPIRone 15 MG tablet Commonly known as: BUSPAR Take 15 mg by mouth 3 (three) times daily.   DULoxetine 30 MG capsule Commonly known as: CYMBALTA Take 90 mg by mouth daily.   HumaLOG 100 UNIT/ML injection Generic drug: insulin lispro ON TANDEM PUMP WILL USE GREATER THAN 250 UNITS PER 3  DAYS OVER 2 500 UNITS PER MONTH (WILL REPLACE APIDRA ONCE ON INSULIN PUMP)   Insulin Glargine 100 UNIT/ML Solostar Pen Commonly known as: LANTUS Off-pump: Lantus/Levemir or Basaglar pen: 22 u as soon as off pump/ea.24 hrs off pump. Restart pump after 20 hrs from last longacting shot.   lamoTRIgine 100 MG tablet Commonly known as: LAMICTAL Take 200 mg by mouth daily.   pregabalin 300 MG capsule Commonly known as: LYRICA Take 300 mg by mouth 2 (two) times daily.   propranolol ER 60 MG 24 hr capsule Commonly known as: INDERAL LA Take 60 mg by mouth daily.   tiaGABine 4 MG tablet Commonly known as: GABITRIL Take 8 mg by mouth at bedtime.   traMADol 50 MG tablet Commonly known as: ULTRAM Take 50 mg by mouth 2 (two) times daily as needed.   zolpidem 10 MG tablet Commonly known as: AMBIEN Take 10 mg by mouth at bedtime as needed.      Follow-up Information    Fredirick Maudlin, MD Follow up in 1 week(s).   Specialty: General Surgery Contact information: [address] [place] [address] [phone]          This discharge encounter was more than 30-minute most of the time counseling the patient and coordinating plan of care.

## 2019-11-17 NOTE — Discharge Instructions (Signed)
°  Diet: Resume home heart healthy soft low irritant diet.   Activity: Increase activity as tolerated. Light activity and walking are encouraged. Do not drive or drink alcohol if taking narcotic pain medications.  Medications: Resume all home medications. For mild to moderate pain: acetaminophen (Tylenol) or ibuprofen (if no kidney disease).   Take antibiotics as prescribed.   Call office [phone]) at any time if any questions, worsening pain, fevers/chills, bleeding, drainage from incision site, or other concerns.

## 2019-11-17 NOTE — Progress Notes (Signed)
[facility] Hospital Day(s): 2.   Post op day(s):  Marland Kitchen   Interval History: Patient seen and examined, no acute events or new complaints overnight. Patient reports having no abdominal pain.  He denies any nausea or vomiting.  He reports he tolerated full liquid without abdominal pain.  He is passing gas and having bowel movement.  He denies fever or chills.  He even presses abdomen really hard on the right lower quadrant did not have any pain.  There is no pain radiation.  There is no alleviating or aggravating factor.   Vital signs in last 24 hours: [min-max] current  Temp:  [97.8 F (36.6 C)-98.5 F (36.9 C)] 97.8 F (36.6 C) (12/26 0513) Pulse Rate:  [95-96] 95 (12/26 0513) Resp:  [18] 18 (12/26 0513) BP: (140-156)/(85-95) 140/88 (12/26 0513) SpO2:  [97 %-98 %] 98 % (12/26 0513)     Height: 5\' 1"  (154.9 cm) Weight: 86 kg BMI (Calculated): 35.84   Physical Exam:  Constitutional: alert, cooperative and no distress  Respiratory: breathing non-labored at rest  Cardiovascular: regular rate and sinus rhythm  Gastrointestinal: soft, non-tender, and non-distended  Labs:  CBC Latest Ref Rng & Units 11/16/2019 11/15/2019 11/14/2019  WBC 4.0 - 10.5 K/uL 7.2 8.9 10.8(H)  Hemoglobin 13.0 - 17.0 g/dL 11.6(L) 10.9(L) 11.3(L)  Hematocrit 39.0 - 52.0 % 33.8(L) 32.1(L) 32.8(L)  Platelets 150 - 400 K/uL 287 270 230   CMP Latest Ref Rng & Units 11/16/2019 11/14/2019 11/13/2019  Glucose 70 - 99 mg/dL 152(H) 65(L) 255(H)  BUN 6 - 20 mg/dL 8 13 17   Creatinine 0.61 - 1.24 mg/dL 1.06 1.23 1.49(H)  Sodium 135 - 145 mmol/L 139 137 132(L)  Potassium 3.5 - 5.1 mmol/L 3.8 3.3(L) 3.5  Chloride 98 - 111 mmol/L 104 102 94(L)  CO2 22 - 32 mmol/L 25 26 26   Calcium 8.9 - 10.3 mg/dL 8.4(L) 7.7(L) 8.6(L)  Total Protein 6.5 - 8.1 g/dL - 6.6 7.7  Total Bilirubin 0.3 - 1.2 mg/dL - 0.7 0.7  Alkaline Phos 38 - 126 U/L - 120 145(H)  AST 15 - 41 U/L - 34 25  ALT 0 - 44 U/L - 29 26    Imaging  studies: No new pertinent imaging studies   Assessment/Plan:  28 year old male with perforated appendicitis treated with IV antibiotic therapy.  Patient responding well.  Today patient without abdominal pain despite eating full liquid diet.  I will advance diet and if he tolerates he may be able to be discharged with oral antibiotic therapy and followed by [facility] as outpatient.  Arnold Long, MD

## 2019-11-17 NOTE — Progress Notes (Signed)
MD ordered patient to be discharged home.  Discharge instructions were reviewed with the patient and he voiced understanding.  Patient instructed on making follow-up appointment.  Prescriptions sent to the patients pharmacy.  IV was removed with catheter intact.  All patients questions were answered.  Patient had concerns about brother having a teddy bear delivered to him while here but he did not receive it.  I called down to the front desk and they did not see anything but I gave him the number to the hospital to see if he could talk to someone Monday.  Patient did not want to go out in a wheelchair so he walked out with his husband.

## 2019-11-27 ENCOUNTER — Ambulatory Visit (INDEPENDENT_AMBULATORY_CARE_PROVIDER_SITE_OTHER): Payer: Medicare Other | Admitting: General Surgery

## 2019-11-27 ENCOUNTER — Other Ambulatory Visit: Payer: Self-pay

## 2019-11-27 ENCOUNTER — Encounter: Payer: Self-pay | Admitting: General Surgery

## 2019-11-27 VITALS — BP 120/80 | HR 109 | Temp 97.9°F | Resp 12 | Ht 61.0 in | Wt 180.2 lb

## 2019-11-27 DIAGNOSIS — K3533 Acute appendicitis with perforation and localized peritonitis, with abscess: Secondary | ICD-10-CM

## 2019-11-27 MED ORDER — ONDANSETRON 4 MG PO TBDP: 4.0000 mg | ORAL_TABLET | Freq: Three times a day (TID) | ORAL | 0 refills | Status: AC | PRN

## 2019-11-27 NOTE — H&P (View-Only) (Signed)
Patient ID: Richard [name], male   DOB: 03/09/1991, 29 y.o.   MRN: [id number]  Chief Complaint  Patient presents with  . New Patient (Initial Visit)    Appendicitis    HPI Richard [name] is a 29 y.o. male.   He is here for follow-up from his recent hospitalization.  He presented to the emergency department at [facility] on November 13, 2019 with what proved to be perforated appendicitis on CT scan.  He initially left AGAINST MEDICAL ADVICE on the 24th, but thought better of his decision and elected to return to to the hospital.  He received some additional IV antibiotics and was discharged home on a course of oral Augmentin.  He has just completed that course today.  He states that his pain has completely resolved.  He denies any fevers or chills.  He is tolerating a diet and having regular bowel movements.  He does report feeling nauseated in the mornings but states that this goes away through the course of the day.  He denies any emesis.   Past Medical History:  Diagnosis Date  . Diabetes mellitus without complication (HCC)    type one    History reviewed. No pertinent surgical history.  Family History  Problem Relation Age of Onset  . Diabetes Mother   . Heart disease Mother   . Diabetes Father     Social History Social History   Tobacco Use  . Smoking status: Never Smoker  . Smokeless tobacco: Never Used  Substance Use Topics  . Alcohol use: Never  . Drug use: Never    Allergies  Allergen Reactions  . Sulfa Antibiotics Anaphylaxis    Current Outpatient Medications  Medication Sig Dispense Refill  . amoxicillin-clavulanate (AUGMENTIN) 875-125 MG tablet Take 1 tablet by mouth 2 (two) times daily for 10 days. 20 tablet 0  . ARIPiprazole (ABILIFY) 30 MG tablet Take 30 mg by mouth daily.    . DULoxetine (CYMBALTA) 30 MG capsule Take 90 mg by mouth daily.    . HUMALOG 100 UNIT/ML injection ON TANDEM PUMP WILL USE GREATER THAN 250 UNITS PER 3 DAYS OVER 2 500 UNITS PER MONTH  (WILL REPLACE APIDRA ONCE ON INSULIN PUMP)    . Insulin Glargine (LANTUS) 100 UNIT/ML Solostar Pen Off-pump: Lantus/Levemir or Basaglar pen: 22 u as soon as off pump/ea.24 hrs off pump. Restart pump after 20 hrs from last longacting shot.    . lamoTRIgine (LAMICTAL) 100 MG tablet Take 200 mg by mouth daily.    . pregabalin (LYRICA) 300 MG capsule Take 300 mg by mouth 2 (two) times daily.    . propranolol ER (INDERAL LA) 60 MG 24 hr capsule Take 60 mg by mouth daily.    . tiaGABine (GABITRIL) 4 MG tablet Take 8 mg by mouth at bedtime.    . traMADol (ULTRAM) 50 MG tablet Take 50 mg by mouth 2 (two) times daily as needed.    . zolpidem (AMBIEN) 10 MG tablet Take 10 mg by mouth at bedtime as needed.    . ondansetron (ZOFRAN ODT) 4 MG disintegrating tablet Take 1 tablet (4 mg total) by mouth every 8 (eight) hours as needed for nausea or vomiting. 20 tablet 0   No current facility-administered medications for this visit.    Review of Systems Review of Systems  All other systems reviewed and are negative. Or as discussed above.  Blood pressure 120/80, pulse (!) 109, temperature 97.9 F (36.6 C), temperature source Temporal, resp. rate 12, height 5'   1" (1.549 m), weight 180 lb 3.2 oz (81.7 kg), SpO2 96 %. Body mass index is 34.05 kg/m.  Physical Exam Physical Exam Constitutional:      General: He is not in acute distress.    Appearance: He is obese.  HENT:     Head: Normocephalic and atraumatic.     Nose:     Comments: Covered with a mask, secondary to COVID-19 precautions.    Mouth/Throat:     Comments: Covered with a mask, secondary to COVID-19 precautions. Eyes:     General: No scleral icterus.       Right eye: No discharge.        Left eye: No discharge.     Comments: Broken scleral capillaries (from forceful emesis prior to recent hospital admission) bilaterally.  Neck:     Comments: No palpable thyromegaly or dominant thyroid masses appreciated. Cardiovascular:     Rate and  Rhythm: Regular rhythm. Tachycardia present.     Pulses: Normal pulses.  Pulmonary:     Effort: Pulmonary effort is normal. No respiratory distress.     Breath sounds: Normal breath sounds.  Abdominal:     General: Bowel sounds are normal.     Palpations: Abdomen is soft.     Tenderness: There is no abdominal tenderness. There is no guarding or rebound.     Comments: Insulin pump and glucose sensor in place.  Genitourinary:    Comments: Deferred Musculoskeletal:     Cervical back: No rigidity.     Right lower leg: No edema.     Left lower leg: No edema.  Lymphadenopathy:     Cervical: No cervical adenopathy.  Skin:    General: Skin is warm and dry.  Neurological:     General: No focal deficit present.     Mental Status: He is alert and oriented to person, place, and time.  Psychiatric:        Behavior: Behavior normal.     Data Reviewed Results for Richard [name], Richard [name] (MRN [id number]) as of 11/28/2019 14:20  Ref. Range 11/13/2019 18:38 11/14/2019 05:10 11/15/2019 07:50 11/16/2019 04:57  WBC Latest Ref Range: 4.0 - 10.5 K/uL 10.7 (H) 10.8 (H) 8.9 7.2  RBC Latest Ref Range: 4.22 - 5.81 MIL/uL 3.97 (L) 3.70 (L) 3.55 (L) 3.80 (L)  Hemoglobin Latest Ref Range: 13.0 - 17.0 g/dL 71.6 (L) 96.7 (L) 89.3 (L) 11.6 (L)  HCT Latest Ref Range: 39.0 - 52.0 % 36.2 (L) 32.8 (L) 32.1 (L) 33.8 (L)  MCV Latest Ref Range: 80.0 - 100.0 fL 91.2 88.6 90.4 88.9  MCH Latest Ref Range: 26.0 - 34.0 pg 31.0 30.5 30.7 30.5  MCHC Latest Ref Range: 30.0 - 36.0 g/dL 81.0 17.5 10.2 58.5  RDW Latest Ref Range: 11.5 - 15.5 % 12.3 12.7 12.7 13.1  Platelets Latest Ref Range: 150 - 400 K/uL 251 230 270 287  nRBC Latest Ref Range: 0.0 - 0.2 % 0.0 0.0 0.0 0.0  Neutrophils Latest Units: % 76     Lymphocytes Latest Units: % 12     Monocytes Relative Latest Units: % 11     Eosinophil Latest Units: % 0     Basophil Latest Units: % 0     Immature Granulocytes Latest Units: % 1     NEUT# Latest Ref Range: 1.7 - 7.7 K/uL  8.1 (H)     Lymphocyte # Latest Ref Range: 0.7 - 4.0 K/uL 1.3     Monocyte # Latest Ref Range: 0.1 - 1.0  K/uL 1.2 (H)     Eosinophils Absolute Latest Ref Range: 0.0 - 0.5 K/uL 0.0     Basophils Absolute Latest Ref Range: 0.0 - 0.1 K/uL 0.0     Abs Immature Granulocytes Latest Ref Range: 0.00 - 0.07 K/uL 0.09 (H)     BASIC METABOLIC PANEL Unknown    Rpt (A)  COMPREHENSIVE METABOLIC PANEL Unknown Rpt (A) Rpt (A)    Sodium Latest Ref Range: 135 - 145 mmol/L 132 (L) 137  139  Potassium Latest Ref Range: 3.5 - 5.1 mmol/L 3.5 3.3 (L)  3.8  Chloride Latest Ref Range: 98 - 111 mmol/L 94 (L) 102  104  CO2 Latest Ref Range: 22 - 32 mmol/L 26 26  25   Glucose Latest Ref Range: 70 - 99 mg/dL 255 (H) 65 (L)  152 (H)  Mean Plasma Glucose Latest Units: mg/dL  162.81    BUN Latest Ref Range: 6 - 20 mg/dL 17 13  8   Creatinine Latest Ref Range: 0.61 - 1.24 mg/dL 1.49 (H) 1.23  1.06  Calcium Latest Ref Range: 8.9 - 10.3 mg/dL 8.6 (L) 7.7 (L)  8.4 (L)  Anion gap Latest Ref Range: 5 - 15  12 9  10   Phosphorus Latest Ref Range: 2.5 - 4.6 mg/dL    2.9  Magnesium Latest Ref Range: 1.7 - 2.4 mg/dL  1.8  1.9  Alkaline Phosphatase Latest Ref Range: 38 - 126 U/L 145 (H) 120    Albumin Latest Ref Range: 3.5 - 5.0 g/dL 3.5 2.8 (L)    AST Latest Ref Range: 15 - 41 U/L 25 34    ALT Latest Ref Range: 0 - 44 U/L 26 29    Total Protein Latest Ref Range: 6.5 - 8.1 g/dL 7.7 6.6    Total Bilirubin Latest Ref Range: 0.3 - 1.2 mg/dL 0.7 0.7    GFR, Est Non African American Latest Ref Range: >60 mL/min >60 >60  >60  GFR, Est African American Latest Ref Range: >60 mL/min >60 >60  >60   Labs from recent hospitalization reviewed.  WBC only minimally elevated at admission.  Normalized by d/c home.  CT 12/22 reviewed.  I am in agreement with the radiologist's impression: IMPRESSION: Perforated acute appendicitis with a 4.0 x 4.0 x 4.2 cm diameter gas and fluid collection adjacent to the appendix, medial cecum, and terminal  ileum.  CT 12/25 reviewed.  I agree with the impression below: IMPRESSION: 1. Interval decrease in size of the periappendiceal abscess since the CT 3 days ago, currently measuring approximately 3.1 x 3.3 x 3.5 cm. Stable periappendiceal and pericecal inflammation and stable secondary inflammation of the cecum. 2. No free intraperitoneal air elsewhere elsewhere in the abdomen or pelvis, indicating this is a contained perforation. 3. Cholelithiasis without CT evidence of acute cholecystitis.  Assessment 29 y/o male admitted with perforated appendicitis on 11/13/19.  Responding well to antibiotic therapy, currently free of symptoms.  Plan Will plan for interval appendectomy about 8 weeks from the time of his initial presentation. Of note, Dr. Dahlia Byes originally admitted Richard [name].  I offered him the opportunity to have his operation performed by Dr. Dahlia Byes, however he expressed a desire to complete his surgery with me.  I discussed the risks of the operation with Richard [name], including, but not limited to, bleeding, infection, need for partial cecectomy, need to convert to an open operation, injury to surrounding structures/tissues, need for drain placement.  He has agreed to accept these risks and we will  get him scheduled for surgery. Due to his type 1 diabetes, we will request an anesthesiology consult, as well.    Duanne Guess 11/27/2019, 2:46 PM

## 2019-11-27 NOTE — Progress Notes (Signed)
Patient ID: Richard [name], male   DOB: 1991-03-14, 29 y.o.   MRN: [id number]  Chief Complaint  Patient presents with  . New Patient (Initial Visit)    Appendicitis    HPI Richard [name] is a 29 y.o. male.   He is here for follow-up from his recent hospitalization.  He presented to the emergency department at [facility] on November 13, 2019 with what proved to be perforated appendicitis on CT scan.  He initially left AGAINST MEDICAL ADVICE on the 24th, but thought better of his decision and elected to return to to the hospital.  He received some additional IV antibiotics and was discharged home on a course of oral Augmentin.  He has just completed that course today.  He states that his pain has completely resolved.  He denies any fevers or chills.  He is tolerating a diet and having regular bowel movements.  He does report feeling nauseated in the mornings but states that this goes away through the course of the day.  He denies any emesis.   Past Medical History:  Diagnosis Date  . Diabetes mellitus without complication (HCC)    type one    History reviewed. No pertinent surgical history.  Family History  Problem Relation Age of Onset  . Diabetes Mother   . Heart disease Mother   . Diabetes Father     Social History Social History   Tobacco Use  . Smoking status: Never Smoker  . Smokeless tobacco: Never Used  Substance Use Topics  . Alcohol use: Never  . Drug use: Never    Allergies  Allergen Reactions  . Sulfa Antibiotics Anaphylaxis    Current Outpatient Medications  Medication Sig Dispense Refill  . amoxicillin-clavulanate (AUGMENTIN) 875-125 MG tablet Take 1 tablet by mouth 2 (two) times daily for 10 days. 20 tablet 0  . ARIPiprazole (ABILIFY) 30 MG tablet Take 30 mg by mouth daily.    . DULoxetine (CYMBALTA) 30 MG capsule Take 90 mg by mouth daily.    Marland Kitchen HUMALOG 100 UNIT/ML injection ON TANDEM PUMP WILL USE GREATER THAN 250 UNITS PER 3 DAYS OVER 2 500 UNITS PER MONTH  (WILL REPLACE APIDRA ONCE ON INSULIN PUMP)    . Insulin Glargine (LANTUS) 100 UNIT/ML Solostar Pen Off-pump: Lantus/Levemir or Basaglar pen: 22 u as soon as off pump/ea.24 hrs off pump. Restart pump after 20 hrs from last longacting shot.    . lamoTRIgine (LAMICTAL) 100 MG tablet Take 200 mg by mouth daily.    . pregabalin (LYRICA) 300 MG capsule Take 300 mg by mouth 2 (two) times daily.    . propranolol ER (INDERAL LA) 60 MG 24 hr capsule Take 60 mg by mouth daily.    . tiaGABine (GABITRIL) 4 MG tablet Take 8 mg by mouth at bedtime.    . traMADol (ULTRAM) 50 MG tablet Take 50 mg by mouth 2 (two) times daily as needed.    . zolpidem (AMBIEN) 10 MG tablet Take 10 mg by mouth at bedtime as needed.    . ondansetron (ZOFRAN ODT) 4 MG disintegrating tablet Take 1 tablet (4 mg total) by mouth every 8 (eight) hours as needed for nausea or vomiting. 20 tablet 0   No current facility-administered medications for this visit.    Review of Systems Review of Systems  All other systems reviewed and are negative. Or as discussed above.  Blood pressure 120/80, pulse (!) 109, temperature 97.9 F (36.6 C), temperature source Temporal, resp. rate 12, height 5'  1" (1.549 m), weight 180 lb 3.2 oz (81.7 kg), SpO2 96 %. Body mass index is 34.05 kg/m.  Physical Exam Physical Exam Constitutional:      General: He is not in acute distress.    Appearance: He is obese.  HENT:     Head: Normocephalic and atraumatic.     Nose:     Comments: Covered with a mask, secondary to COVID-19 precautions.    Mouth/Throat:     Comments: Covered with a mask, secondary to COVID-19 precautions. Eyes:     General: No scleral icterus.       Right eye: No discharge.        Left eye: No discharge.     Comments: Broken scleral capillaries (from forceful emesis prior to recent hospital admission) bilaterally.  Neck:     Comments: No palpable thyromegaly or dominant thyroid masses appreciated. Cardiovascular:     Rate and  Rhythm: Regular rhythm. Tachycardia present.     Pulses: Normal pulses.  Pulmonary:     Effort: Pulmonary effort is normal. No respiratory distress.     Breath sounds: Normal breath sounds.  Abdominal:     General: Bowel sounds are normal.     Palpations: Abdomen is soft.     Tenderness: There is no abdominal tenderness. There is no guarding or rebound.     Comments: Insulin pump and glucose sensor in place.  Genitourinary:    Comments: Deferred Musculoskeletal:     Cervical back: No rigidity.     Right lower leg: No edema.     Left lower leg: No edema.  Lymphadenopathy:     Cervical: No cervical adenopathy.  Skin:    General: Skin is warm and dry.  Neurological:     General: No focal deficit present.     Mental Status: He is alert and oriented to person, place, and time.  Psychiatric:        Behavior: Behavior normal.     Data Reviewed Results for Richard [name], Richard [name] (MRN [id number]) as of 11/28/2019 14:20  Ref. Range 11/13/2019 18:38 11/14/2019 05:10 11/15/2019 07:50 11/16/2019 04:57  WBC Latest Ref Range: 4.0 - 10.5 K/uL 10.7 (H) 10.8 (H) 8.9 7.2  RBC Latest Ref Range: 4.22 - 5.81 MIL/uL 3.97 (L) 3.70 (L) 3.55 (L) 3.80 (L)  Hemoglobin Latest Ref Range: 13.0 - 17.0 g/dL 71.6 (L) 96.7 (L) 89.3 (L) 11.6 (L)  HCT Latest Ref Range: 39.0 - 52.0 % 36.2 (L) 32.8 (L) 32.1 (L) 33.8 (L)  MCV Latest Ref Range: 80.0 - 100.0 fL 91.2 88.6 90.4 88.9  MCH Latest Ref Range: 26.0 - 34.0 pg 31.0 30.5 30.7 30.5  MCHC Latest Ref Range: 30.0 - 36.0 g/dL 81.0 17.5 10.2 58.5  RDW Latest Ref Range: 11.5 - 15.5 % 12.3 12.7 12.7 13.1  Platelets Latest Ref Range: 150 - 400 K/uL 251 230 270 287  nRBC Latest Ref Range: 0.0 - 0.2 % 0.0 0.0 0.0 0.0  Neutrophils Latest Units: % 76     Lymphocytes Latest Units: % 12     Monocytes Relative Latest Units: % 11     Eosinophil Latest Units: % 0     Basophil Latest Units: % 0     Immature Granulocytes Latest Units: % 1     NEUT# Latest Ref Range: 1.7 - 7.7 K/uL  8.1 (H)     Lymphocyte # Latest Ref Range: 0.7 - 4.0 K/uL 1.3     Monocyte # Latest Ref Range: 0.1 - 1.0  K/uL 1.2 (H)     Eosinophils Absolute Latest Ref Range: 0.0 - 0.5 K/uL 0.0     Basophils Absolute Latest Ref Range: 0.0 - 0.1 K/uL 0.0     Abs Immature Granulocytes Latest Ref Range: 0.00 - 0.07 K/uL 0.09 (H)     BASIC METABOLIC PANEL Unknown    Rpt (A)  COMPREHENSIVE METABOLIC PANEL Unknown Rpt (A) Rpt (A)    Sodium Latest Ref Range: 135 - 145 mmol/L 132 (L) 137  139  Potassium Latest Ref Range: 3.5 - 5.1 mmol/L 3.5 3.3 (L)  3.8  Chloride Latest Ref Range: 98 - 111 mmol/L 94 (L) 102  104  CO2 Latest Ref Range: 22 - 32 mmol/L 26 26  25   Glucose Latest Ref Range: 70 - 99 mg/dL 255 (H) 65 (L)  152 (H)  Mean Plasma Glucose Latest Units: mg/dL  162.81    BUN Latest Ref Range: 6 - 20 mg/dL 17 13  8   Creatinine Latest Ref Range: 0.61 - 1.24 mg/dL 1.49 (H) 1.23  1.06  Calcium Latest Ref Range: 8.9 - 10.3 mg/dL 8.6 (L) 7.7 (L)  8.4 (L)  Anion gap Latest Ref Range: 5 - 15  12 9  10   Phosphorus Latest Ref Range: 2.5 - 4.6 mg/dL    2.9  Magnesium Latest Ref Range: 1.7 - 2.4 mg/dL  1.8  1.9  Alkaline Phosphatase Latest Ref Range: 38 - 126 U/L 145 (H) 120    Albumin Latest Ref Range: 3.5 - 5.0 g/dL 3.5 2.8 (L)    AST Latest Ref Range: 15 - 41 U/L 25 34    ALT Latest Ref Range: 0 - 44 U/L 26 29    Total Protein Latest Ref Range: 6.5 - 8.1 g/dL 7.7 6.6    Total Bilirubin Latest Ref Range: 0.3 - 1.2 mg/dL 0.7 0.7    GFR, Est Non African American Latest Ref Range: >60 mL/min >60 >60  >60  GFR, Est African American Latest Ref Range: >60 mL/min >60 >60  >60   Labs from recent hospitalization reviewed.  WBC only minimally elevated at admission.  Normalized by d/c home.  CT 12/22 reviewed.  I am in agreement with the radiologist's impression: IMPRESSION: Perforated acute appendicitis with a 4.0 x 4.0 x 4.2 cm diameter gas and fluid collection adjacent to the appendix, medial cecum, and terminal  ileum.  CT 12/25 reviewed.  I agree with the impression below: IMPRESSION: 1. Interval decrease in size of the periappendiceal abscess since the CT 3 days ago, currently measuring approximately 3.1 x 3.3 x 3.5 cm. Stable periappendiceal and pericecal inflammation and stable secondary inflammation of the cecum. 2. No free intraperitoneal air elsewhere elsewhere in the abdomen or pelvis, indicating this is a contained perforation. 3. Cholelithiasis without CT evidence of acute cholecystitis.  Assessment 29 y/o male admitted with perforated appendicitis on 11/13/19.  Responding well to antibiotic therapy, currently free of symptoms.  Plan Will plan for interval appendectomy about 8 weeks from the time of his initial presentation. Of note, Dr. Dahlia Byes originally admitted Richard [name].  I offered him the opportunity to have his operation performed by Dr. Dahlia Byes, however he expressed a desire to complete his surgery with me.  I discussed the risks of the operation with Richard [name], including, but not limited to, bleeding, infection, need for partial cecectomy, need to convert to an open operation, injury to surrounding structures/tissues, need for drain placement.  He has agreed to accept these risks and we will  get him scheduled for surgery. Due to his type 1 diabetes, we will request an anesthesiology consult, as well.    Duanne Guess 11/27/2019, 2:46 PM

## 2019-11-27 NOTE — Patient Instructions (Addendum)
Please pick up your medication (Zofran) at your pharmacy.   Our Surgery Scheduler will contact you within 24-48 hours to schedule your surgery.  Please have the [facility] Sheet available when she calls you.   Laparoscopic Appendectomy, Adult  A laparoscopic appendectomy is a surgery to take out the appendix. The appendix is a finger-like structure that is attached to the large intestine. In this surgery, the appendix is removed through three small incisions with the help of a thin, lighted tube that has a camera (laparoscope). This procedure may be done to prevent an inflamed appendix from bursting (rupturing). It may also be done to treat the infection from an appendix that has already ruptured. It is usually done right after inflammation of the appendix (appendicitis) is diagnosed. This is a minimally invasive surgery. It usually results in less pain, fewer problems, and a quicker recovery than surgery done through a large incision. Tell a health care provider about:  Any allergies you have.  All medicines you are taking, including vitamins, herbs, eye drops, creams, and over-the-counter medicines.  Use of steroids (by mouth or creams).  Any problems you or family members have had with anesthetic medicines.  Any blood disorders you have.  Any surgeries you have had.  Any medical conditions you have.  Whether you are pregnant or may be pregnant. What are the risks? Generally, this is a safe procedure. However, problems may occur, including:  Infection.  Bleeding.  Allergic reactions to medicines.  Damage to other structures or organs.  The formation of pus (abscesses).  Long-lasting pain or scarring at the incision sites or inside the abdomen.  Blood clots in the legs. What happens before the procedure? Eating and drinking restrictions Follow instructions from your health care provider about eating and drinking restrictions. You may be asked not to eat or drink as soon as the  diagnosis of appendicitis is made. Medicines Ask your health care provider about:  Changing or stopping your regular medicines. This is especially important if you are taking diabetes medicines or blood thinners.  Taking medicines such as aspirin and ibuprofen. These medicines can thin your blood. Do not take these medicines unless your health care provider tells you to take them.  Taking over-the-counter medicines, vitamins, herbs, and supplements. General instructions  Plan to have someone take you home from the hospital.  If you will be going home right after the procedure, plan to have someone with you for 24 hours.  You may be given antibiotic medicine to help prevent infection or to treat existing inflammation or infection.  Ask your health care provider how your surgical site will be marked or identified.  Ask your health care provider what steps will be taken to help prevent infection. These may include: ? Removing hair at the surgery site. ? Washing skin with a germ-killing soap. ? Taking antibiotic medicine. What happens during the procedure?   An IV will be inserted into one of your veins.  You will be given one or more of the following: ? A medicine to help you relax (sedative). ? A medicine to numb the area (local anesthetic). ? A medicine to make you fall asleep (general anesthetic).  A thin, flexible tube (catheter) may be put into your bladder to drain urine.  A tube may be passed through your nose and into your stomach (NG tube, or nasogastric tube) to drain any stomach contents.  Your surgeon will make three small incisions near your belly button (navel).  Air-like gas  will be used to fill your abdomen. The gas will make your abdomen expand. This helps the surgeon see clearly and gives him or her more room to work.  A laparoscope will be passed through one of the incisions.  Other long, thin surgical instruments will be passed through the other  incisions.  The appendix will be located and removed through one of the incisions.  The abdomen may be washed out to remove bacteria.  The incisions will be closed with stitches (sutures), staples, or adhesive strips.  A bandage (dressing) may be used to cover the incisions.  If a tube was inserted into your bladder or stomach, it will be removed. The procedure may vary among health care providers and hospitals. What happens after the procedure?  Your blood pressure, heart rate, breathing rate, and blood oxygen level will be monitored until you leave the hospital.  You will be given medicines as needed to control pain.  Do not drive for 24 hours if you were given a sedative during your procedure.  If your appendix did not rupture, you may be able to go home the same day after your surgery.  If your appendix ruptured: ? You will get antibiotic medicine through an IV line. ? You may be sent home with a temporary drain. Summary  A laparoscopic appendectomy is a surgery to take out the appendix. The appendix is removed through three small incisions with the help of a thin, lighted tube that has a camera.  This is a safe procedure, but there are some risks, including bleeding, infection, allergic reaction to medicines, or damage to other organs.  You may be asked not to eat or drink as soon as a diagnosis of appendicitis is made.  After the procedure, your blood pressure, heart rate, breathing rate, and blood oxygen level will be monitored until you leave the hospital. This information is not intended to replace advice given to you by your health care provider. Make sure you discuss any questions you have with your health care provider. Document Revised: 05/11/2018 Document Reviewed: 05/11/2018 Elsevier Patient Education  2020 [employer].

## 2019-12-12 ENCOUNTER — Telehealth: Payer: Self-pay | Admitting: General Surgery

## 2019-12-12 NOTE — Telephone Encounter (Signed)
Pt has been advised of pre admission date/time, Covid Testing date and Surgery date.  Surgery Date: 12/20/19 Preadmission Testing Date: 12/13/19 Covid Testing Date: 12/18/19 - patient advised to go to [facility] ([facility])  Patient has been made aware to call [phone], between 1-3:00pm the day before surgery, to find out what time to arrive.

## 2019-12-13 ENCOUNTER — Other Ambulatory Visit: Payer: Self-pay

## 2019-12-13 ENCOUNTER — Encounter
Admission: RE | Admit: 2019-12-13 | Discharge: 2019-12-13 | Disposition: A | Discharge status: Home / Self Care | Payer: Medicare Other | Source: Ambulatory Visit | Attending: General Surgery | Admitting: General Surgery

## 2019-12-13 DIAGNOSIS — Z01818 Encounter for other preprocedural examination: Secondary | ICD-10-CM | POA: Insufficient documentation

## 2019-12-13 DIAGNOSIS — E118 Type 2 diabetes mellitus with unspecified complications: Secondary | ICD-10-CM | POA: Insufficient documentation

## 2019-12-13 HISTORY — DX: Gastro-esophageal reflux disease without esophagitis: K21.9

## 2019-12-13 HISTORY — DX: Unspecified asthma, uncomplicated: J45.909

## 2019-12-13 NOTE — Patient Instructions (Signed)
Your procedure is scheduled on: 12-20-19 THURSDAY Report to Same Day Surgery 2nd floor medical mall [facility] Entrance-take elevator on left to 2nd floor.  Check in with surgery information desk.) To find out your arrival time please call [phone] between 1PM - 3PM on 12-19-19 [facility]  Remember: Instructions that are not followed completely may result in serious medical risk, up to and including death, or upon the discretion of your surgeon and anesthesiologist your surgery may need to be rescheduled.    _x___ 1. Do not eat food after midnight the night before your procedure. NO GUM OR CANDY AFTER MIDNIGHT. You may drink WATER up to 2 hours before you are scheduled to arrive at the hospital for your procedure.  Do not drink WATER within 2 hours of your scheduled arrival to the hospital.  TYPE 1 AND TYPE 2 DIABETICS CAN ONLY DRINK WATER   ____Ensure clear carbohydrate drink on the way to the hospital for bariatric patients  ____Ensure clear carbohydrate drink 3 hours before surgery.    __x__ 2. No Alcohol for 24 hours before or after surgery.   __x__3. No Smoking or e-cigarettes for 24 prior to surgery.  Do not use any chewable tobacco products for at least 6 hour prior to surgery   ____  4. Bring all medications with you on the day of surgery if instructed.    __x__ 5. Notify your doctor if there is any change in your medical condition     (cold, fever, infections).    x___6. On the morning of surgery brush your teeth with toothpaste and water.  You may rinse your mouth with mouth wash if you wish.  Do not swallow any toothpaste or mouthwash.   Do not wear jewelry, make-up, hairpins, clips or nail polish.  Do not wear lotions, powders, or perfumes.   Do not shave 48 hours prior to surgery. Men may shave face and neck.  Do not bring valuables to the hospital.    [facility] is not responsible for any belongings or valuables.               Contacts, dentures or bridgework  may not be worn into surgery.  Leave your suitcase in the car. After surgery it may be brought to your room.  For patients admitted to the hospital, discharge time is determined by your treatment team.  _  Patients discharged the day of surgery will not be allowed to drive home.  You will need someone to drive you home and stay with you the night of your procedure.    Please read over the following fact sheets that you were given:   [facility] Preparing for Surgery and or MRSA Information   _x___ TAKE THE FOLLOWING MEDICATION THE MORNING OF SURGERY WITH A SMALL SIP OF WATER. These include:  1. LYRICA  2.  3.  4.  5.  6.  ____Fleets enema or Magnesium Citrate as directed.   _x___ Use CHG Soap or sage wipes as directed on instruction sheet   ____ Use inhalers on the day of surgery and bring to hospital day of surgery  ____ Stop Metformin and Janumet 2 days prior to surgery.    _X___ INSULIN PUMP AS USUAL  ____ Follow recommendations from Cardiologist, Pulmonologist or PCP regarding stopping Aspirin, Coumadin, Plavix ,Eliquis, Effient, or Pradaxa, and Pletal.  X____Stop Anti-inflammatories such as Advil, Aleve, Ibuprofen, Motrin, Naproxen, Naprosyn, Goodies powders or aspirin products NOW- OK to take Tylenol  ____ Stop supplements until after surgery.     ____ Bring C-Pap to the hospital.

## 2019-12-17 ENCOUNTER — Other Ambulatory Visit: Payer: Self-pay

## 2019-12-17 ENCOUNTER — Encounter
Admission: RE | Admit: 2019-12-17 | Discharge: 2019-12-17 | Disposition: A | Discharge status: Home / Self Care | Payer: Medicare Other | Source: Ambulatory Visit | Attending: General Surgery | Admitting: General Surgery

## 2019-12-17 DIAGNOSIS — Z01812 Encounter for preprocedural laboratory examination: Secondary | ICD-10-CM | POA: Diagnosis not present

## 2019-12-17 DIAGNOSIS — Z01810 Encounter for preprocedural cardiovascular examination: Secondary | ICD-10-CM | POA: Diagnosis not present

## 2019-12-17 DIAGNOSIS — E118 Type 2 diabetes mellitus with unspecified complications: Secondary | ICD-10-CM | POA: Diagnosis not present

## 2019-12-18 ENCOUNTER — Other Ambulatory Visit
Admission: RE | Admit: 2019-12-18 | Discharge: 2019-12-18 | Disposition: A | Discharge status: Home / Self Care | Payer: Medicare Other | Source: Ambulatory Visit | Attending: General Surgery | Admitting: General Surgery

## 2019-12-18 DIAGNOSIS — K3532 Acute appendicitis with perforation and localized peritonitis, without abscess: Secondary | ICD-10-CM | POA: Diagnosis not present

## 2019-12-18 DIAGNOSIS — R140 Abdominal distension (gaseous): Secondary | ICD-10-CM | POA: Diagnosis not present

## 2019-12-18 DIAGNOSIS — Z01812 Encounter for preprocedural laboratory examination: Secondary | ICD-10-CM | POA: Insufficient documentation

## 2019-12-18 DIAGNOSIS — Z20822 Contact with and (suspected) exposure to covid-19: Secondary | ICD-10-CM | POA: Insufficient documentation

## 2019-12-18 LAB — SARS CORONAVIRUS 2 (TAT 6-24 HRS): SARS Coronavirus 2: NEGATIVE

## 2019-12-20 ENCOUNTER — Other Ambulatory Visit: Payer: Self-pay

## 2019-12-20 ENCOUNTER — Ambulatory Visit (HOSPITAL_BASED_OUTPATIENT_CLINIC_OR_DEPARTMENT_OTHER)
Admission: RE | Admit: 2019-12-20 | Discharge: 2019-12-20 | Disposition: A | Discharge status: Home / Self Care | Payer: Medicare Other | Source: Home / Self Care | Attending: General Surgery | Admitting: General Surgery

## 2019-12-20 ENCOUNTER — Inpatient Hospital Stay
Admission: EM | Admit: 2019-12-20 | Discharge: 2019-12-28 | DRG: 329 | Disposition: A | Discharge status: Home Health | Payer: Medicare Other | Attending: Student | Admitting: Student

## 2019-12-20 ENCOUNTER — Ambulatory Visit: Payer: Medicare Other | Admitting: Anesthesiology

## 2019-12-20 ENCOUNTER — Encounter
Admission: RE | Disposition: A | Discharge status: Home / Self Care | Payer: Self-pay | Source: Home / Self Care | Attending: General Surgery

## 2019-12-20 ENCOUNTER — Encounter: Payer: Self-pay | Admitting: General Surgery

## 2019-12-20 DIAGNOSIS — E669 Obesity, unspecified: Secondary | ICD-10-CM | POA: Diagnosis present

## 2019-12-20 DIAGNOSIS — K9172 Accidental puncture and laceration of a digestive system organ or structure during other procedure: Secondary | ICD-10-CM

## 2019-12-20 DIAGNOSIS — Z79899 Other long term (current) drug therapy: Secondary | ICD-10-CM | POA: Insufficient documentation

## 2019-12-20 DIAGNOSIS — Z6837 Body mass index (BMI) 37.0-37.9, adult: Secondary | ICD-10-CM

## 2019-12-20 DIAGNOSIS — K660 Peritoneal adhesions (postprocedural) (postinfection): Secondary | ICD-10-CM | POA: Insufficient documentation

## 2019-12-20 DIAGNOSIS — R000 Tachycardia, unspecified: Secondary | ICD-10-CM | POA: Diagnosis present

## 2019-12-20 DIAGNOSIS — A419 Sepsis, unspecified organism: Secondary | ICD-10-CM | POA: Diagnosis present

## 2019-12-20 DIAGNOSIS — S36498A Other injury of other part of small intestine, initial encounter: Secondary | ICD-10-CM | POA: Diagnosis not present

## 2019-12-20 DIAGNOSIS — Z833 Family history of diabetes mellitus: Secondary | ICD-10-CM | POA: Insufficient documentation

## 2019-12-20 DIAGNOSIS — K3532 Acute appendicitis with perforation and localized peritonitis, without abscess: Secondary | ICD-10-CM | POA: Insufficient documentation

## 2019-12-20 DIAGNOSIS — Y658 Other specified misadventures during surgical and medical care: Secondary | ICD-10-CM | POA: Diagnosis not present

## 2019-12-20 DIAGNOSIS — K3533 Acute appendicitis with perforation and localized peritonitis, with abscess: Secondary | ICD-10-CM | POA: Diagnosis not present

## 2019-12-20 DIAGNOSIS — IMO0002 Reserved for concepts with insufficient information to code with codable children: Secondary | ICD-10-CM

## 2019-12-20 DIAGNOSIS — Z20822 Contact with and (suspected) exposure to covid-19: Secondary | ICD-10-CM | POA: Diagnosis present

## 2019-12-20 DIAGNOSIS — E10649 Type 1 diabetes mellitus with hypoglycemia without coma: Secondary | ICD-10-CM | POA: Diagnosis not present

## 2019-12-20 DIAGNOSIS — Z6834 Body mass index (BMI) 34.0-34.9, adult: Secondary | ICD-10-CM | POA: Insufficient documentation

## 2019-12-20 DIAGNOSIS — T8119XA Other postprocedural shock, initial encounter: Secondary | ICD-10-CM | POA: Diagnosis not present

## 2019-12-20 DIAGNOSIS — F419 Anxiety disorder, unspecified: Secondary | ICD-10-CM | POA: Diagnosis present

## 2019-12-20 DIAGNOSIS — T8144XA Sepsis following a procedure, initial encounter: Secondary | ICD-10-CM | POA: Diagnosis not present

## 2019-12-20 DIAGNOSIS — N179 Acute kidney failure, unspecified: Secondary | ICD-10-CM | POA: Diagnosis not present

## 2019-12-20 DIAGNOSIS — R110 Nausea: Secondary | ICD-10-CM

## 2019-12-20 DIAGNOSIS — Z794 Long term (current) use of insulin: Secondary | ICD-10-CM

## 2019-12-20 DIAGNOSIS — Z9641 Presence of insulin pump (external) (internal): Secondary | ICD-10-CM | POA: Diagnosis present

## 2019-12-20 DIAGNOSIS — R140 Abdominal distension (gaseous): Secondary | ICD-10-CM

## 2019-12-20 DIAGNOSIS — R100 Acute abdomen: Secondary | ICD-10-CM | POA: Diagnosis present

## 2019-12-20 DIAGNOSIS — Z882 Allergy status to sulfonamides status: Secondary | ICD-10-CM | POA: Insufficient documentation

## 2019-12-20 DIAGNOSIS — G8918 Other acute postprocedural pain: Secondary | ICD-10-CM | POA: Diagnosis present

## 2019-12-20 DIAGNOSIS — K9171 Accidental puncture and laceration of a digestive system organ or structure during a digestive system procedure: Secondary | ICD-10-CM | POA: Diagnosis not present

## 2019-12-20 DIAGNOSIS — K219 Gastro-esophageal reflux disease without esophagitis: Secondary | ICD-10-CM | POA: Insufficient documentation

## 2019-12-20 DIAGNOSIS — E109 Type 1 diabetes mellitus without complications: Secondary | ICD-10-CM | POA: Insufficient documentation

## 2019-12-20 DIAGNOSIS — Z8249 Family history of ischemic heart disease and other diseases of the circulatory system: Secondary | ICD-10-CM | POA: Insufficient documentation

## 2019-12-20 DIAGNOSIS — E876 Hypokalemia: Secondary | ICD-10-CM | POA: Diagnosis not present

## 2019-12-20 HISTORY — PX: LAPAROSCOPIC APPENDECTOMY: SHX408

## 2019-12-20 LAB — CBC
HCT: 48.2 % (ref 39.0–52.0)
Hemoglobin: 15.9 g/dL (ref 13.0–17.0)
MCH: 30.4 pg (ref 26.0–34.0)
MCHC: 33 g/dL (ref 30.0–36.0)
MCV: 92.2 fL (ref 80.0–100.0)
Platelets: 189 10*3/uL (ref 150–400)
RBC: 5.23 MIL/uL (ref 4.22–5.81)
RDW: 12.9 % (ref 11.5–15.5)
WBC: 2.1 10*3/uL — ABNORMAL LOW (ref 4.0–10.5)
nRBC: 0 % (ref 0.0–0.2)

## 2019-12-20 LAB — COMPREHENSIVE METABOLIC PANEL
ALT: 40 U/L (ref 0–44)
AST: 50 U/L — ABNORMAL HIGH (ref 15–41)
Albumin: 4 g/dL (ref 3.5–5.0)
Alkaline Phosphatase: 69 U/L (ref 38–126)
Anion gap: 10 (ref 5–15)
BUN: 19 mg/dL (ref 6–20)
CO2: 21 mmol/L — ABNORMAL LOW (ref 22–32)
Calcium: 8.5 mg/dL — ABNORMAL LOW (ref 8.9–10.3)
Chloride: 106 mmol/L (ref 98–111)
Creatinine, Ser: 1.45 mg/dL — ABNORMAL HIGH (ref 0.61–1.24)
GFR calc Af Amer: >60 mL/min (ref >60)
GFR calc non Af Amer: >60 mL/min (ref >60)
Glucose, Bld: 252 mg/dL — ABNORMAL HIGH (ref 70–99)
Potassium: 4.7 mmol/L (ref 3.5–5.1)
Sodium: 137 mmol/L (ref 135–145)
Total Bilirubin: 1.3 mg/dL — ABNORMAL HIGH (ref 0.3–1.2)
Total Protein: 7.1 g/dL (ref 6.5–8.1)

## 2019-12-20 LAB — GLUCOSE, CAPILLARY
Glucose-Capillary: 114 mg/dL — ABNORMAL HIGH (ref 70–99)
Glucose-Capillary: 203 mg/dL — ABNORMAL HIGH (ref 70–99)
Glucose-Capillary: 213 mg/dL — ABNORMAL HIGH (ref 70–99)
Glucose-Capillary: 186 mg/dL — ABNORMAL HIGH (ref 70–99)

## 2019-12-20 LAB — LIPASE, BLOOD: Lipase: 20 U/L (ref 11–51)

## 2019-12-20 SURGERY — APPENDECTOMY, LAPAROSCOPIC
Anesthesia: General | Site: Abdomen

## 2019-12-20 MED ORDER — MIDAZOLAM HCL 2 MG/2ML IJ SOLN
INTRAMUSCULAR | Status: AC
  Filled 2019-12-20: qty 2

## 2019-12-20 MED ORDER — LIDOCAINE HCL (PF) 1 % IJ SOLN
INTRAMUSCULAR | Status: AC
  Filled 2019-12-20: qty 30

## 2019-12-20 MED ORDER — HYDROCODONE-ACETAMINOPHEN 5-325 MG PO TABS: 1.0000 | ORAL_TABLET | Freq: Four times a day (QID) | ORAL | 0 refills | Status: DC | PRN

## 2019-12-20 MED ORDER — ALBUTEROL SULFATE HFA 108 (90 BASE) MCG/ACT IN AERS
INHALATION_SPRAY | RESPIRATORY_TRACT | Status: DC | PRN
  Administered 2019-12-20: 6 via RESPIRATORY_TRACT

## 2019-12-20 MED ORDER — ONDANSETRON HCL 4 MG/2ML IJ SOLN
INTRAMUSCULAR | Status: AC
  Filled 2019-12-20: qty 2

## 2019-12-20 MED ORDER — MORPHINE SULFATE (PF) 2 MG/ML IV SOLN
2.0000 mg | Freq: Once | INTRAVENOUS | Status: AC
  Administered 2019-12-20: 22:00:00 2 mg via INTRAVENOUS

## 2019-12-20 MED ORDER — BUPIVACAINE HCL (PF) 0.25 % IJ SOLN
INTRAMUSCULAR | Status: DC | PRN
  Administered 2019-12-20: 7.5 mL

## 2019-12-20 MED ORDER — SUCCINYLCHOLINE CHLORIDE 20 MG/ML IJ SOLN
INTRAMUSCULAR | Status: AC
  Filled 2019-12-20: qty 1

## 2019-12-20 MED ORDER — MIDAZOLAM HCL 2 MG/2ML IJ SOLN
INTRAMUSCULAR | Status: DC | PRN
  Administered 2019-12-20: 2 mg via INTRAVENOUS

## 2019-12-20 MED ORDER — PHENYLEPHRINE HCL (PRESSORS) 10 MG/ML IV SOLN
INTRAVENOUS | Status: DC | PRN
  Administered 2019-12-20 (×2): 100 ug via INTRAVENOUS

## 2019-12-20 MED ORDER — ROCURONIUM BROMIDE 50 MG/5ML IV SOLN
INTRAVENOUS | Status: AC
  Filled 2019-12-20: qty 1

## 2019-12-20 MED ORDER — FENTANYL CITRATE (PF) 100 MCG/2ML IJ SOLN
INTRAMUSCULAR | Status: AC
  Filled 2019-12-20: qty 2

## 2019-12-20 MED ORDER — CHLORHEXIDINE GLUCONATE CLOTH 2 % EX PADS: 6.0000 | MEDICATED_PAD | Freq: Once | CUTANEOUS | Status: DC

## 2019-12-20 MED ORDER — MORPHINE SULFATE (PF) 2 MG/ML IV SOLN
INTRAVENOUS | Status: AC
  Filled 2019-12-20: qty 2

## 2019-12-20 MED ORDER — FENTANYL CITRATE (PF) 100 MCG/2ML IJ SOLN
INTRAMUSCULAR | Status: DC | PRN
  Administered 2019-12-20 (×3): 50 ug via INTRAVENOUS

## 2019-12-20 MED ORDER — EPINEPHRINE PF 1 MG/ML IJ SOLN
INTRAMUSCULAR | Status: AC
  Filled 2019-12-20: qty 1

## 2019-12-20 MED ORDER — SODIUM CHLORIDE 0.9 % IV SOLN
2.0000 g | INTRAVENOUS | Status: AC
  Administered 2019-12-20: 2 g via INTRAVENOUS
  Filled 2019-12-20: qty 2

## 2019-12-20 MED ORDER — SUGAMMADEX SODIUM 200 MG/2ML IV SOLN
INTRAVENOUS | Status: DC | PRN
  Administered 2019-12-20: 50 mg via INTRAVENOUS
  Administered 2019-12-20: 150 mg via INTRAVENOUS

## 2019-12-20 MED ORDER — ROCURONIUM BROMIDE 100 MG/10ML IV SOLN
INTRAVENOUS | Status: DC | PRN
  Administered 2019-12-20: 5 mg via INTRAVENOUS
  Administered 2019-12-20 (×2): 10 mg via INTRAVENOUS
  Administered 2019-12-20: 25 mg via INTRAVENOUS

## 2019-12-20 MED ORDER — SODIUM CHLORIDE 0.9 % IV SOLN: INTRAVENOUS | Status: DC

## 2019-12-20 MED ORDER — SUCCINYLCHOLINE CHLORIDE 20 MG/ML IJ SOLN
INTRAMUSCULAR | Status: DC | PRN
  Administered 2019-12-20: 100 mg via INTRAVENOUS

## 2019-12-20 MED ORDER — GABAPENTIN 300 MG PO CAPS: 300.0000 mg | ORAL_CAPSULE | ORAL | Status: DC

## 2019-12-20 MED ORDER — FENTANYL CITRATE (PF) 100 MCG/2ML IJ SOLN
25.0000 ug | INTRAMUSCULAR | Status: DC | PRN
  Administered 2019-12-20: 25 ug via INTRAVENOUS

## 2019-12-20 MED ORDER — DEXAMETHASONE SODIUM PHOSPHATE 10 MG/ML IJ SOLN
INTRAMUSCULAR | Status: AC
  Filled 2019-12-20: qty 1

## 2019-12-20 MED ORDER — OXYCODONE HCL 5 MG/5ML PO SOLN: 5.0000 mg | Freq: Once | ORAL | Status: DC | PRN

## 2019-12-20 MED ORDER — LIDOCAINE-EPINEPHRINE 1 %-1:100000 IJ SOLN
INTRAMUSCULAR | Status: AC
  Filled 2019-12-20: qty 1

## 2019-12-20 MED ORDER — CHLORHEXIDINE GLUCONATE CLOTH 2 % EX PADS
6.0000 | MEDICATED_PAD | Freq: Once | CUTANEOUS | Status: AC
  Administered 2019-12-20: 6 via TOPICAL

## 2019-12-20 MED ORDER — DEXAMETHASONE SODIUM PHOSPHATE 10 MG/ML IJ SOLN
INTRAMUSCULAR | Status: DC | PRN
  Administered 2019-12-20: 5 mg via INTRAVENOUS

## 2019-12-20 MED ORDER — FAMOTIDINE 20 MG PO TABS: 20.0000 mg | ORAL_TABLET | Freq: Once | ORAL | Status: AC

## 2019-12-20 MED ORDER — ONDANSETRON HCL 4 MG/2ML IJ SOLN
INTRAMUSCULAR | Status: DC | PRN
  Administered 2019-12-20: 4 mg via INTRAVENOUS

## 2019-12-20 MED ORDER — IOHEXOL 9 MG/ML PO SOLN: 500.0000 mL | Freq: Once | ORAL | Status: DC | PRN

## 2019-12-20 MED ORDER — ACETAMINOPHEN 500 MG PO TABS
ORAL_TABLET | ORAL | Status: AC
  Administered 2019-12-20: 1000 mg via ORAL
  Filled 2019-12-20: qty 2

## 2019-12-20 MED ORDER — OXYCODONE HCL 5 MG PO TABS: 5.0000 mg | ORAL_TABLET | Freq: Once | ORAL | Status: DC | PRN

## 2019-12-20 MED ORDER — FLUMAZENIL 0.5 MG/5ML IV SOLN
INTRAVENOUS | Status: DC | PRN
  Administered 2019-12-20: .1 mg via INTRAVENOUS

## 2019-12-20 MED ORDER — GABAPENTIN 300 MG PO CAPS
ORAL_CAPSULE | ORAL | Status: AC
  Filled 2019-12-20: qty 1

## 2019-12-20 MED ORDER — FENTANYL CITRATE (PF) 100 MCG/2ML IJ SOLN
INTRAMUSCULAR | Status: AC
  Administered 2019-12-20: 25 ug via INTRAVENOUS
  Filled 2019-12-20: qty 2

## 2019-12-20 MED ORDER — LIDOCAINE HCL (CARDIAC) PF 100 MG/5ML IV SOSY
PREFILLED_SYRINGE | INTRAVENOUS | Status: DC | PRN
  Administered 2019-12-20: 100 mg via INTRAVENOUS

## 2019-12-20 MED ORDER — SODIUM CHLORIDE 0.9% FLUSH: 3.0000 mL | Freq: Once | INTRAVENOUS | Status: DC

## 2019-12-20 MED ORDER — LIDOCAINE-EPINEPHRINE 1 %-1:100000 IJ SOLN
INTRAMUSCULAR | Status: DC | PRN
  Administered 2019-12-20: 7.5 mL

## 2019-12-20 MED ORDER — ONDANSETRON HCL 4 MG/2ML IJ SOLN
4.0000 mg | Freq: Once | INTRAMUSCULAR | Status: AC | PRN
  Administered 2019-12-20: 4 mg via INTRAVENOUS

## 2019-12-20 MED ORDER — PROPOFOL 10 MG/ML IV BOLUS
INTRAVENOUS | Status: DC | PRN
  Administered 2019-12-20: 120 mg via INTRAVENOUS
  Administered 2019-12-20: 30 mg via INTRAVENOUS

## 2019-12-20 MED ORDER — BUPIVACAINE HCL (PF) 0.25 % IJ SOLN
INTRAMUSCULAR | Status: AC
  Filled 2019-12-20: qty 30

## 2019-12-20 MED ORDER — KETOROLAC TROMETHAMINE 30 MG/ML IJ SOLN
INTRAMUSCULAR | Status: DC | PRN
  Administered 2019-12-20: 30 mg via INTRAVENOUS

## 2019-12-20 MED ORDER — ACETAMINOPHEN 500 MG PO TABS: 1000.0000 mg | ORAL_TABLET | ORAL | Status: AC

## 2019-12-20 MED ORDER — PROMETHAZINE HCL 25 MG/ML IJ SOLN: 6.2500 mg | INTRAMUSCULAR | Status: DC | PRN

## 2019-12-20 MED ORDER — FAMOTIDINE 20 MG PO TABS
ORAL_TABLET | ORAL | Status: AC
  Administered 2019-12-20: 20 mg via ORAL
  Filled 2019-12-20: qty 1

## 2019-12-20 SURGICAL SUPPLY — 51 items
APPLICATOR COTTON TIP 6 STRL (MISCELLANEOUS) ×1 IMPLANT
APPLICATOR COTTON TIP 6IN STRL (MISCELLANEOUS) ×2
APPLIER CLIP 5 13 M/L LIGAMAX5 (MISCELLANEOUS)
BLADE SURG SZ11 CARB STEEL (BLADE) ×2 IMPLANT
CANISTER SUCT 1200ML W/VALVE (MISCELLANEOUS) ×2 IMPLANT
CHLORAPREP W/TINT 26 (MISCELLANEOUS) ×2 IMPLANT
CLIP APPLIE 5 13 M/L LIGAMAX5 (MISCELLANEOUS) IMPLANT
COVER WAND RF STERILE (DRAPES) ×2 IMPLANT
CUTTER FLEX LINEAR 45M (STAPLE) ×2 IMPLANT
DEFOGGER SCOPE WARMER CLEARIFY (MISCELLANEOUS) ×2 IMPLANT
DERMABOND ADVANCED (GAUZE/BANDAGES/DRESSINGS) ×1
DERMABOND ADVANCED .7 DNX12 (GAUZE/BANDAGES/DRESSINGS) ×1 IMPLANT
ELECT CAUTERY BLADE 6.4 (BLADE) ×2 IMPLANT
ELECT CAUTERY BLADE TIP 2.5 (TIP) ×2
ELECT REM PT RETURN 9FT ADLT (ELECTROSURGICAL) ×2
ELECTRODE CAUTERY BLDE TIP 2.5 (TIP) ×1 IMPLANT
ELECTRODE REM PT RTRN 9FT ADLT (ELECTROSURGICAL) ×1 IMPLANT
GLOVE BIO SURGEON STRL SZ 6.5 (GLOVE) ×2 IMPLANT
GLOVE INDICATOR 7.0 STRL GRN (GLOVE) ×4 IMPLANT
GOWN STRL REUS W/ TWL LRG LVL3 (GOWN DISPOSABLE) ×2 IMPLANT
GOWN STRL REUS W/TWL LRG LVL3 (GOWN DISPOSABLE) ×2
GRASPER SUT TROCAR 14GX15 (MISCELLANEOUS) ×2 IMPLANT
IRRIGATION STRYKERFLOW (MISCELLANEOUS) ×1 IMPLANT
IRRIGATOR STRYKERFLOW (MISCELLANEOUS) ×2
IV NS 1000ML (IV SOLUTION) ×1
IV NS 1000ML BAXH (IV SOLUTION) ×1 IMPLANT
KIT TURNOVER KIT A (KITS) ×2 IMPLANT
KITTNER LAPARASCOPIC 5X40 (MISCELLANEOUS) ×2 IMPLANT
LABEL OR SOLS (LABEL) ×2 IMPLANT
LIGASURE LAP MARYLAND 5MM 37CM (ELECTROSURGICAL) IMPLANT
NEEDLE HYPO 22GX1.5 SAFETY (NEEDLE) ×2 IMPLANT
NS IRRIG 500ML POUR BTL (IV SOLUTION) ×2 IMPLANT
PACK LAP CHOLECYSTECTOMY (MISCELLANEOUS) ×2 IMPLANT
PENCIL ELECTRO HAND CTR (MISCELLANEOUS) ×2 IMPLANT
POUCH SPECIMEN RETRIEVAL 10MM (ENDOMECHANICALS) ×2 IMPLANT
RELOAD STAPLE TA45 3.5 REG BLU (ENDOMECHANICALS) ×2 IMPLANT
SCISSORS METZENBAUM CVD 33 (INSTRUMENTS) IMPLANT
SET TUBE SMOKE EVAC HIGH FLOW (TUBING) ×2 IMPLANT
SHEARS HARMONIC ACE PLUS 36CM (ENDOMECHANICALS) ×2 IMPLANT
SLEEVE ADV FIXATION 5X100MM (TROCAR) ×4 IMPLANT
STRIP CLOSURE SKIN 1/2X4 (GAUZE/BANDAGES/DRESSINGS) ×2 IMPLANT
SUT MNCRL 4-0 (SUTURE) ×1
SUT MNCRL 4-0 27XMFL (SUTURE) ×1
SUT VIC AB 3-0 SH 27 (SUTURE) ×1
SUT VIC AB 3-0 SH 27X BRD (SUTURE) ×1 IMPLANT
SUT VICRYL 0 AB UR-6 (SUTURE) ×2 IMPLANT
SUTURE MNCRL 4-0 27XMF (SUTURE) ×1 IMPLANT
TRAY FOLEY MTR SLVR 16FR STAT (SET/KITS/TRAYS/PACK) ×2 IMPLANT
TROCAR 130MM GELPORT  DAV (MISCELLANEOUS) IMPLANT
TROCAR ADV FIXATION 12X100MM (TROCAR) ×2 IMPLANT
TROCAR Z-THREAD OPTICAL 5X100M (TROCAR) ×2 IMPLANT

## 2019-12-20 NOTE — Anesthesia Procedure Notes (Signed)
Procedure Name: Intubation Date/Time: 12/20/2019 12:39 PM Performed by: Cristino Martes, RN Pre-anesthesia Checklist: Patient identified, Patient being monitored, Timeout performed, Emergency Drugs available and Suction available Patient Re-evaluated:Patient Re-evaluated prior to induction Oxygen Delivery Method: Circle system utilized Preoxygenation: Pre-oxygenation with 100% oxygen Induction Type: IV induction and Rapid sequence Laryngoscope Size: McGraph and 4 Grade View: Grade II Tube type: Oral Tube size: 7.5 mm Number of attempts: 1 Airway Equipment and Method: Stylet Placement Confirmation: ETT inserted through vocal cords under direct vision,  positive ETCO2 and breath sounds checked- equal and bilateral Secured at: 19 cm Tube secured with: Tape Dental Injury: Teeth and Oropharynx as per pre-operative assessment  Difficulty Due To: Difficulty was anticipated and Difficult Airway- due to limited oral opening Comments: Pt 5'1", limited neck space, round face with limited mouth opening. View II with McGraph

## 2019-12-20 NOTE — Anesthesia Preprocedure Evaluation (Signed)
Anesthesia Evaluation  Patient identified by MRN, date of birth, ID band Patient awake    Reviewed: Allergy & Precautions, H&P , NPO status , Patient's Chart, lab work & pertinent test results  Airway Mallampati: II  TM Distance: >3 FB Neck ROM: full   Comment: extremely large neck beard Dental  (+) Chipped   Pulmonary neg shortness of breath, asthma (childhood) ,           Cardiovascular (-) angina(-) Past MI and (-) Cardiac Stents negative cardio ROS  (-) dysrhythmias      Neuro/Psych negative neurological ROS  negative psych ROS   GI/Hepatic Neg liver ROS, GERD  Controlled,  Endo/Other  diabetes, Type 1Insulin pump  Renal/GU      Musculoskeletal   Abdominal   Peds  Hematology negative hematology ROS (+)   Anesthesia Other Findings Abdominal obesity  Past Medical History: No date: Asthma     Comment:  AS A CHILD No date: Diabetes mellitus without complication (HCC)     Comment:  type one No date: GERD (gastroesophageal reflux disease)     Comment:  OCC NO MEDS  Past Surgical History: No date: NO PAST SURGERIES     Reproductive/Obstetrics negative OB ROS                             Anesthesia Physical Anesthesia Plan  ASA: III  Anesthesia Plan: General ETT   Post-op Pain Management:    Induction: Rapid sequence, Cricoid pressure planned and Intravenous  PONV Risk Score and Plan: Ondansetron, Dexamethasone, Midazolam and Treatment may vary due to age or medical condition  Airway Management Planned: Video Laryngoscope Planned  Additional Equipment:   Intra-op Plan:   Post-operative Plan:   Informed Consent: I have reviewed the patients History and Physical, chart, labs and discussed the procedure including the risks, benefits and alternatives for the proposed anesthesia with the patient or authorized representative who has indicated his/her understanding and  acceptance.     Dental Advisory Given  Plan Discussed with: Anesthesiologist, CRNA and Surgeon  Anesthesia Plan Comments:         Anesthesia Quick Evaluation

## 2019-12-20 NOTE — Interval H&P Note (Signed)
History and Physical Interval Note:  12/20/2019 10:48 AM  Richard [name]  has presented today for surgery, with the diagnosis of Perforated appendicitis.  The various methods of treatment have been discussed with the patient and family. After consideration of risks, benefits and other options for treatment, the patient has consented to  Procedure(s): APPENDECTOMY LAPAROSCOPIC (N/A) as a surgical intervention.  The patient's history has been reviewed, patient examined, no change in status, stable for surgery.  I have reviewed the patient's chart and labs.  Questions were answered to the patient's satisfaction.     Duanne Guess

## 2019-12-20 NOTE — Op Note (Signed)
Operative Note  Laparoscopic Appendectomy   Rozelle Logan Date of operation:  12/20/2019  Indications: The patient presented to the hospital near the end of December with a history of  abdominal pain. Workup revealed findings consistent with acute perforated appendicitis. He was treated conservatively with antibiotics.  He presents today for interval appendectomy.  Pre-operative Diagnosis: Appendicitis, unqualified  Post-operative Diagnosis: Same  Surgeon: Duanne Guess, MD  Anesthesia: GETA  Findings: adhesions consistent with prior perforation; appendix retrocecal, no abscess seen  Estimated Blood Loss: minimal         Specimens: appendix         Complications:  None immediately apparent.  Procedure Details  The patient was seen again in the preop area. The options of surgery versus observation were reviewed with the patient and/or family. The risks of bleeding, infection, recurrence of symptoms, negative laparoscopy, potential for an open procedure, bowel injury, abscess or infection, were all reviewed as well. The patient was taken to Operating Room, identified as Rozelle Logan and the procedure verified as laparoscopic appendectomy. A time out was performed and the above information confirmed.  The patient was placed in the supine position and general anesthesia was induced.  Antibiotic prophylaxis was administered and VTE prophylaxis was in place. A Foley catheter was placed by the nursing staff.   The abdomen was prepped and draped in a sterile fashion. Due to his central obesity, Optiview technique was used to achieve access to the abdomen via the RUQ.  Pneumoperitoneum was obtained.  An supraumbilical trocar was inserted under direct visualization. Two 5 mm working ports were placed under direct visualization.  There was significant intraabdominal adipose tissue  Adhesions were lysed until the retrocecal appendix was identified. The appendix was carefully dissected.  The mesoappendix was divided with the Harmonic scalpel. The base of the appendix was dissected out and divided with a standard load Endo GIA.The appendix was placed in a Endo pouch bag and removed via the umbilical port. The right lower quadrant and pelvis was then irrigated with normal saline which was then aspirated. The right lower quadrant was inspected there was no sign of bleeding or bowel injury therefore pneumoperitoneum was released, all ports were removed.  The umbilical fascia was closed with 0 Vicryl interrupted sutures and the skin incisions were approximated with subcuticular 4-0 Monocryl. Dermabond was applied, followed by Steri-Strips. The patient tolerated the procedure well and there were no immediately apparent complications. The sponge lap and needle count were correct at the end of the procedure.  The patient was taken to the recovery room in stable condition.   Duanne Guess, MD, FACS

## 2019-12-20 NOTE — Discharge Instructions (Signed)
Laparoscopic Appendectomy, Adult, Care After This sheet gives you information about how to care for yourself after your procedure. Your health care provider may also give you more specific instructions. If you have problems or questions, contact your health care provider. What can I expect after the procedure? After the procedure, it is common to have:  Little energy for normal activities.  Mild pain in the area where the incisions were made.  Difficulty passing stool (constipation). This can be caused by: ? Pain medicine. ? A decrease in your activity. Follow these instructions at home: Medicines  Take over-the-counter and prescription medicines only as told by your health care provider.  If you were prescribed an antibiotic medicine, take it as told by your health care provider. Do not stop taking the antibiotic even if you start to feel better.  Do not drive or use heavy machinery while taking prescription pain medicine.  Ask your health care provider if the medicine prescribed to you can cause constipation. You may need to take steps to prevent or treat constipation, such as: ? Drink enough fluid to keep your urine pale yellow. ? Take over-the-counter or prescription medicines. ? Eat foods that are high in fiber, such as beans, whole grains, and fresh fruits and vegetables. ? Limit foods that are high in fat and processed sugars, such as fried or sweet foods. Incision care   Follow instructions from your health care provider about how to take care of your incisions. Make sure you: ? Wash your hands with soap and water before and after you change your bandage (dressing). If soap and water are not available, use hand sanitizer. ? Change your dressing as told by your health care provider. ? Leave stitches (sutures), skin glue, or adhesive strips in place. These skin closures may need to stay in place for 2 weeks or longer. If adhesive strip edges start to loosen and curl up, you may  trim the loose edges. Do not remove adhesive strips completely unless your health care provider tells you to do that.  Check your incision areas every day for signs of infection. Check for: ? Redness, swelling, or pain. ? Fluid or blood. ? Warmth. ? Pus or a bad smell. Bathing  Keep your incisions clean and dry. Clean them as often as told by your health care provider. To do this: 1. Gently wash the incisions with soap and water. 2. Rinse the incisions with water to remove all soap. 3. Pat the incisions dry with a clean towel. Do not rub the incisions.  Do not take baths, swim, or use a hot tub for 2 weeks, or until your health care provider approves. You may take showers after 48 hours. Activity   Do not drive for 24 hours if you were given a sedative during your procedure.  Rest after the procedure. Return to your normal activities as told by your health care provider. Ask your health care provider what activities are safe for you.  For 3 weeks, or for as long as told by your health care provider: ? Do not lift anything that is heavier than 10 lb (4.5 kg), or the limit that you are told. ? Do not play contact sports. General instructions  If you were sent home with a drain, follow instructions from your health care provider about how to care for it.  Take deep breaths. This helps to prevent your lungs from developing an infection (pneumonia).  Keep all follow-up visits as told by your health   care provider. This is important. Contact a health care provider if:  You have redness, swelling, or pain around an incision.  You have fluid or blood coming from an incision.  Your incision feels warm to the touch.  You have pus or a bad smell coming from an incision or dressing.  Your incision edges break open after your sutures have been removed.  You have increasing pain in your shoulders.  You feel dizzy or you faint.  You develop shortness of breath.  You keep feeling  nauseous or you are vomiting.  You have diarrhea or you cannot control your bowel functions.  You lose your appetite.  You develop swelling or pain in your legs.  You develop a rash. Get help right away if you have:  A fever.  Difficulty breathing.  Sharp pains in your chest. Summary  After a laparoscopic appendectomy, it is common to have little energy for normal activities, mild pain in the area of the incisions, and constipation.  Infection is the most common complication after this procedure. Follow your health care provider's instructions about caring for yourself after the procedure.  Rest after the procedure. Return to your normal activities as told by your health care provider.  Contact your health care provider if you notice signs of infection around your incisions or you develop shortness of breath. Get help right away if you have a fever, chest pain, or difficulty breathing. This information is not intended to replace advice given to you by your health care provider. Make sure you discuss any questions you have with your health care provider. Document Revised: 05/11/2018 Document Reviewed: 05/11/2018 Elsevier Patient Education  2020 [employer]    AMBULATORY SURGERY  DISCHARGE INSTRUCTIONS   1) The drugs that you were given will stay in your system until tomorrow so for the next 24 hours you should not:  A) Drive an automobile B) Make any legal decisions C) Drink any alcoholic beverage   2) You may resume regular meals tomorrow.  Today it is better to start with liquids and gradually work up to solid foods.  You may eat anything you prefer, but it is better to start with liquids, then soup and crackers, and gradually work up to solid foods.   3) Please notify your doctor immediately if you have any unusual bleeding, trouble breathing, redness and pain at the surgery site, drainage, fever, or pain not relieved by medication.    4) Additional  Instructions:    Please contact your physician with any problems or Same Day Surgery at [phone], Monday through Friday 6 am to 4 pm, or [place] [facility] [place] Main number at [phone]. 

## 2019-12-20 NOTE — Transfer of Care (Signed)
Immediate Anesthesia Transfer of Care Note  Patient: Richard [name]  Procedure(s) Performed: APPENDECTOMY LAPAROSCOPIC (N/A Abdomen)  Patient Location: PACU  Anesthesia Type:General  Level of Consciousness: drowsy and patient cooperative  Airway & Oxygen Therapy: Patient Spontanous Breathing and Patient connected to face mask oxygen  Post-op Assessment: Report given to RN and Post -op Vital signs reviewed and stable  Post vital signs: Reviewed and stable  Last Vitals:  Vitals Value Taken Time  BP 123/65 12/20/19 1430  Temp 36.4 C 12/20/19 1430  Pulse 105 12/20/19 1434  Resp 15 12/20/19 1434  SpO2 100 % 12/20/19 1434  Vitals shown include unvalidated device data.  Last Pain:  Vitals:   12/20/19 1014  TempSrc: Temporal  PainSc: 0-No pain         Complications: No apparent anesthesia complications

## 2019-12-20 NOTE — ED Notes (Signed)
Pt received 4mg  total of morphine in triage

## 2019-12-20 NOTE — ED Notes (Signed)
Pt in obvious pain. Pt reports movement makes it worse.

## 2019-12-20 NOTE — ED Triage Notes (Signed)
Pt to the er for lower abd pain following an appendectomy done laprascopic earlier today. Pt was d/c from hospital at 1600. Pt took last dose of hydrocodone at 1700.

## 2019-12-21 ENCOUNTER — Encounter
Admission: EM | Disposition: A | Discharge status: Home Health | Payer: Self-pay | Source: Home / Self Care | Attending: Student

## 2019-12-21 ENCOUNTER — Emergency Department: Payer: Medicare Other

## 2019-12-21 ENCOUNTER — Observation Stay: Payer: Medicare Other | Admitting: Anesthesiology

## 2019-12-21 ENCOUNTER — Encounter: Payer: Self-pay | Admitting: Radiology

## 2019-12-21 ENCOUNTER — Inpatient Hospital Stay: Payer: Medicare Other

## 2019-12-21 DIAGNOSIS — R110 Nausea: Secondary | ICD-10-CM | POA: Diagnosis not present

## 2019-12-21 DIAGNOSIS — Y658 Other specified misadventures during surgical and medical care: Secondary | ICD-10-CM | POA: Diagnosis not present

## 2019-12-21 DIAGNOSIS — Z794 Long term (current) use of insulin: Secondary | ICD-10-CM | POA: Diagnosis not present

## 2019-12-21 DIAGNOSIS — E669 Obesity, unspecified: Secondary | ICD-10-CM | POA: Diagnosis present

## 2019-12-21 DIAGNOSIS — R100 Acute abdomen: Secondary | ICD-10-CM | POA: Diagnosis not present

## 2019-12-21 DIAGNOSIS — G8918 Other acute postprocedural pain: Secondary | ICD-10-CM | POA: Diagnosis present

## 2019-12-21 DIAGNOSIS — E10649 Type 1 diabetes mellitus with hypoglycemia without coma: Secondary | ICD-10-CM | POA: Diagnosis not present

## 2019-12-21 DIAGNOSIS — Z8249 Family history of ischemic heart disease and other diseases of the circulatory system: Secondary | ICD-10-CM | POA: Diagnosis not present

## 2019-12-21 DIAGNOSIS — Z20822 Contact with and (suspected) exposure to covid-19: Secondary | ICD-10-CM | POA: Diagnosis present

## 2019-12-21 DIAGNOSIS — R6521 Severe sepsis with septic shock: Secondary | ICD-10-CM | POA: Diagnosis not present

## 2019-12-21 DIAGNOSIS — S36498A Other injury of other part of small intestine, initial encounter: Secondary | ICD-10-CM | POA: Diagnosis not present

## 2019-12-21 DIAGNOSIS — E876 Hypokalemia: Secondary | ICD-10-CM | POA: Diagnosis not present

## 2019-12-21 DIAGNOSIS — K219 Gastro-esophageal reflux disease without esophagitis: Secondary | ICD-10-CM | POA: Diagnosis present

## 2019-12-21 DIAGNOSIS — Z9641 Presence of insulin pump (external) (internal): Secondary | ICD-10-CM | POA: Diagnosis present

## 2019-12-21 DIAGNOSIS — K9172 Accidental puncture and laceration of a digestive system organ or structure during other procedure: Secondary | ICD-10-CM

## 2019-12-21 DIAGNOSIS — IMO0002 Reserved for concepts with insufficient information to code with codable children: Secondary | ICD-10-CM

## 2019-12-21 DIAGNOSIS — A419 Sepsis, unspecified organism: Secondary | ICD-10-CM | POA: Diagnosis present

## 2019-12-21 DIAGNOSIS — T8144XA Sepsis following a procedure, initial encounter: Secondary | ICD-10-CM | POA: Diagnosis not present

## 2019-12-21 DIAGNOSIS — R140 Abdominal distension (gaseous): Secondary | ICD-10-CM | POA: Diagnosis present

## 2019-12-21 DIAGNOSIS — N179 Acute kidney failure, unspecified: Secondary | ICD-10-CM | POA: Diagnosis not present

## 2019-12-21 DIAGNOSIS — Z833 Family history of diabetes mellitus: Secondary | ICD-10-CM | POA: Diagnosis not present

## 2019-12-21 DIAGNOSIS — K9171 Accidental puncture and laceration of a digestive system organ or structure during a digestive system procedure: Secondary | ICD-10-CM | POA: Diagnosis not present

## 2019-12-21 DIAGNOSIS — K3532 Acute appendicitis with perforation and localized peritonitis, without abscess: Secondary | ICD-10-CM | POA: Diagnosis present

## 2019-12-21 DIAGNOSIS — F419 Anxiety disorder, unspecified: Secondary | ICD-10-CM | POA: Diagnosis present

## 2019-12-21 DIAGNOSIS — T8119XA Other postprocedural shock, initial encounter: Secondary | ICD-10-CM | POA: Diagnosis not present

## 2019-12-21 DIAGNOSIS — R000 Tachycardia, unspecified: Secondary | ICD-10-CM | POA: Diagnosis present

## 2019-12-21 DIAGNOSIS — Z6837 Body mass index (BMI) 37.0-37.9, adult: Secondary | ICD-10-CM | POA: Diagnosis not present

## 2019-12-21 DIAGNOSIS — Z79899 Other long term (current) drug therapy: Secondary | ICD-10-CM | POA: Diagnosis not present

## 2019-12-21 HISTORY — PX: APPLICATION OF WOUND VAC: SHX5189

## 2019-12-21 HISTORY — PX: LAPAROTOMY: SHX154

## 2019-12-21 HISTORY — PX: LAPAROSCOPY: SHX197

## 2019-12-21 HISTORY — PX: SMALL BOWEL REPAIR: SHX6447

## 2019-12-21 LAB — LACTIC ACID, PLASMA
Lactic Acid, Venous: 3.6 mmol/L (ref 0.5–1.9)
Lactic Acid, Venous: 4.5 mmol/L (ref 0.5–1.9)
Lactic Acid, Venous: 2.8 mmol/L (ref 0.5–1.9)

## 2019-12-21 LAB — GLUCOSE, CAPILLARY
Glucose-Capillary: 106 mg/dL — ABNORMAL HIGH (ref 70–99)
Glucose-Capillary: 117 mg/dL — ABNORMAL HIGH (ref 70–99)
Glucose-Capillary: 129 mg/dL — ABNORMAL HIGH (ref 70–99)
Glucose-Capillary: 179 mg/dL — ABNORMAL HIGH (ref 70–99)
Glucose-Capillary: 70 mg/dL (ref 70–99)
Glucose-Capillary: 71 mg/dL (ref 70–99)
Glucose-Capillary: 72 mg/dL (ref 70–99)
Glucose-Capillary: 76 mg/dL (ref 70–99)
Glucose-Capillary: 77 mg/dL (ref 70–99)
Glucose-Capillary: 80 mg/dL (ref 70–99)
Glucose-Capillary: 80 mg/dL (ref 70–99)

## 2019-12-21 LAB — BASIC METABOLIC PANEL
Anion gap: 6 (ref 5–15)
BUN: 24 mg/dL — ABNORMAL HIGH (ref 6–20)
CO2: 22 mmol/L (ref 22–32)
Calcium: 7.6 mg/dL — ABNORMAL LOW (ref 8.9–10.3)
Chloride: 111 mmol/L (ref 98–111)
Creatinine, Ser: 1.72 mg/dL — ABNORMAL HIGH (ref 0.61–1.24)
GFR calc Af Amer: >60 mL/min (ref >60)
GFR calc non Af Amer: 54 mL/min — ABNORMAL LOW (ref >60)
Glucose, Bld: 177 mg/dL — ABNORMAL HIGH (ref 70–99)
Potassium: 4.1 mmol/L (ref 3.5–5.1)
Sodium: 139 mmol/L (ref 135–145)

## 2019-12-21 LAB — URINALYSIS, COMPLETE (UACMP) WITH MICROSCOPIC
Bacteria, UA: NONE SEEN
Bilirubin Urine: NEGATIVE
Glucose, UA: NEGATIVE mg/dL
Hgb urine dipstick: NEGATIVE
Ketones, ur: NEGATIVE mg/dL
Leukocytes,Ua: NEGATIVE
Nitrite: NEGATIVE
Protein, ur: NEGATIVE mg/dL
Specific Gravity, Urine: >1.046 — ABNORMAL HIGH (ref 1.005–1.030)
pH: 5 (ref 5.0–8.0)

## 2019-12-21 LAB — MAGNESIUM: Magnesium: 1.1 mg/dL — ABNORMAL LOW (ref 1.7–2.4)

## 2019-12-21 LAB — CBC
HCT: 41.2 % (ref 39.0–52.0)
Hemoglobin: 13.7 g/dL (ref 13.0–17.0)
MCH: 31.3 pg (ref 26.0–34.0)
MCHC: 33.3 g/dL (ref 30.0–36.0)
MCV: 94.1 fL (ref 80.0–100.0)
Platelets: 160 10*3/uL (ref 150–400)
RBC: 4.38 MIL/uL (ref 4.22–5.81)
RDW: 13.2 % (ref 11.5–15.5)
WBC: 2 10*3/uL — ABNORMAL LOW (ref 4.0–10.5)
nRBC: 0 % (ref 0.0–0.2)

## 2019-12-21 LAB — PROCALCITONIN: Procalcitonin: 1.5 ng/mL

## 2019-12-21 LAB — PHOSPHORUS: Phosphorus: 2.2 mg/dL — ABNORMAL LOW (ref 2.5–4.6)

## 2019-12-21 SURGERY — LAPAROSCOPY, DIAGNOSTIC
Anesthesia: General

## 2019-12-21 MED ORDER — BUPIVACAINE LIPOSOME 1.3 % IJ SUSP
INTRAMUSCULAR | Status: AC
  Filled 2019-12-21: qty 20

## 2019-12-21 MED ORDER — SODIUM CHLORIDE 0.9% FLUSH: 3.0000 mL | INTRAVENOUS | Status: DC | PRN

## 2019-12-21 MED ORDER — ONDANSETRON HCL 4 MG/2ML IJ SOLN
4.0000 mg | Freq: Four times a day (QID) | INTRAMUSCULAR | Status: DC | PRN
  Administered 2019-12-21: 4 mg via INTRAVENOUS

## 2019-12-21 MED ORDER — TIAGABINE HCL 2 MG PO TABS
8.0000 mg | ORAL_TABLET | Freq: Every day | ORAL | Status: DC
  Filled 2019-12-21 (×8): qty 4

## 2019-12-21 MED ORDER — 0.9 % SODIUM CHLORIDE (POUR BTL) OPTIME
TOPICAL | Status: DC | PRN
  Administered 2019-12-21: 11:00:00 4500 mL

## 2019-12-21 MED ORDER — IOHEXOL 300 MG/ML  SOLN
100.0000 mL | Freq: Once | INTRAMUSCULAR | Status: AC | PRN
  Administered 2019-12-21: 01:00:00 100 mL via INTRAVENOUS

## 2019-12-21 MED ORDER — PROMETHAZINE HCL 25 MG/ML IJ SOLN: 6.2500 mg | INTRAMUSCULAR | Status: DC | PRN

## 2019-12-21 MED ORDER — FENTANYL CITRATE (PF) 100 MCG/2ML IJ SOLN
INTRAMUSCULAR | Status: AC
  Filled 2019-12-21: qty 2

## 2019-12-21 MED ORDER — OXYCODONE HCL 5 MG/5ML PO SOLN: 5.0000 mg | Freq: Once | ORAL | Status: DC | PRN

## 2019-12-21 MED ORDER — SODIUM CHLORIDE 0.9 % IV SOLN: INTRAVENOUS | Status: DC

## 2019-12-21 MED ORDER — PHENYLEPHRINE 40 MCG/ML (10ML) SYRINGE FOR IV PUSH (FOR BLOOD PRESSURE SUPPORT)
40.0000 ug | PREFILLED_SYRINGE | Freq: Once | INTRAVENOUS | Status: AC
  Administered 2019-12-21: 40 ug via INTRAVENOUS
  Filled 2019-12-21: qty 10

## 2019-12-21 MED ORDER — PROPRANOLOL HCL ER 60 MG PO CP24
60.0000 mg | ORAL_CAPSULE | Freq: Every day | ORAL | Status: DC
  Administered 2019-12-25 – 2019-12-27 (×3): 60 mg via ORAL
  Filled 2019-12-21 (×8): qty 1

## 2019-12-21 MED ORDER — ONDANSETRON HCL 4 MG/2ML IJ SOLN
INTRAMUSCULAR | Status: DC | PRN
  Administered 2019-12-21: 4 mg via INTRAVENOUS

## 2019-12-21 MED ORDER — DULOXETINE HCL 30 MG PO CPEP
90.0000 mg | ORAL_CAPSULE | Freq: Every day | ORAL | Status: DC
  Administered 2019-12-21 – 2019-12-27 (×4): 90 mg via ORAL
  Filled 2019-12-21 (×5): qty 3

## 2019-12-21 MED ORDER — ARIPIPRAZOLE 15 MG PO TABS
30.0000 mg | ORAL_TABLET | Freq: Every day | ORAL | Status: DC
  Administered 2019-12-25 – 2019-12-27 (×3): 30 mg via ORAL
  Filled 2019-12-21 (×8): qty 2

## 2019-12-21 MED ORDER — DEXMEDETOMIDINE HCL 200 MCG/2ML IV SOLN
INTRAVENOUS | Status: DC | PRN
  Administered 2019-12-21: 8 ug via INTRAVENOUS

## 2019-12-21 MED ORDER — PROPOFOL 10 MG/ML IV BOLUS
INTRAVENOUS | Status: AC
  Filled 2019-12-21: qty 20

## 2019-12-21 MED ORDER — LIDOCAINE HCL (PF) 2 % IJ SOLN
INTRAMUSCULAR | Status: AC
  Filled 2019-12-21: qty 5

## 2019-12-21 MED ORDER — FENTANYL CITRATE (PF) 100 MCG/2ML IJ SOLN
INTRAMUSCULAR | Status: DC | PRN
  Administered 2019-12-21 (×3): 50 ug via INTRAVENOUS

## 2019-12-21 MED ORDER — SUCCINYLCHOLINE CHLORIDE 20 MG/ML IJ SOLN
INTRAMUSCULAR | Status: AC
  Filled 2019-12-21: qty 1

## 2019-12-21 MED ORDER — BUPIVACAINE LIPOSOME 1.3 % IJ SUSP
INTRAMUSCULAR | Status: DC | PRN
  Administered 2019-12-21: 20 mL

## 2019-12-21 MED ORDER — SODIUM CHLORIDE 0.9 % IV SOLN
250.0000 mL | INTRAVENOUS | Status: DC | PRN
  Administered 2019-12-23 – 2019-12-28 (×6): 250 mL via INTRAVENOUS

## 2019-12-21 MED ORDER — ONDANSETRON HCL 4 MG/2ML IJ SOLN
INTRAMUSCULAR | Status: AC
  Filled 2019-12-21: qty 2

## 2019-12-21 MED ORDER — SODIUM CHLORIDE 0.9 % IV BOLUS
500.0000 mL | Freq: Once | INTRAVENOUS | Status: AC
  Administered 2019-12-21: 13:00:00 500 mL via INTRAVENOUS

## 2019-12-21 MED ORDER — LIDOCAINE HCL (CARDIAC) PF 100 MG/5ML IV SOSY
PREFILLED_SYRINGE | INTRAVENOUS | Status: DC | PRN
  Administered 2019-12-21: 100 mg via INTRAVENOUS

## 2019-12-21 MED ORDER — ENOXAPARIN SODIUM 40 MG/0.4ML ~~LOC~~ SOLN
40.0000 mg | SUBCUTANEOUS | Status: DC
  Administered 2019-12-21 – 2019-12-28 (×8): 40 mg via SUBCUTANEOUS
  Filled 2019-12-21 (×8): qty 0.4

## 2019-12-21 MED ORDER — MIDAZOLAM HCL 2 MG/2ML IJ SOLN
INTRAMUSCULAR | Status: AC
  Filled 2019-12-21: qty 2

## 2019-12-21 MED ORDER — OXYCODONE HCL 5 MG PO TABS: 5.0000 mg | ORAL_TABLET | Freq: Once | ORAL | Status: DC | PRN

## 2019-12-21 MED ORDER — ACETAMINOPHEN 10 MG/ML IV SOLN
INTRAVENOUS | Status: DC | PRN
  Administered 2019-12-21: 1000 mg via INTRAVENOUS

## 2019-12-21 MED ORDER — HYDROMORPHONE HCL 1 MG/ML IJ SOLN
1.0000 mg | INTRAMUSCULAR | Status: DC | PRN
  Administered 2019-12-21 – 2019-12-26 (×28): 1 mg via INTRAVENOUS
  Filled 2019-12-21 (×29): qty 1

## 2019-12-21 MED ORDER — SODIUM CHLORIDE 0.9 % IV SOLN
INTRAVENOUS | Status: DC | PRN
  Administered 2019-12-21: 50 ug/min via INTRAVENOUS

## 2019-12-21 MED ORDER — PHENYLEPHRINE 40 MCG/ML (10ML) SYRINGE FOR IV PUSH (FOR BLOOD PRESSURE SUPPORT)
PREFILLED_SYRINGE | INTRAVENOUS | Status: AC
  Filled 2019-12-21: qty 10

## 2019-12-21 MED ORDER — DEXAMETHASONE SODIUM PHOSPHATE 10 MG/ML IJ SOLN
INTRAMUSCULAR | Status: DC | PRN
  Administered 2019-12-21: 5 mg via INTRAVENOUS

## 2019-12-21 MED ORDER — FAMOTIDINE IN NACL 20-0.9 MG/50ML-% IV SOLN
20.0000 mg | Freq: Two times a day (BID) | INTRAVENOUS | Status: DC
  Administered 2019-12-21 – 2019-12-24 (×8): 20 mg via INTRAVENOUS
  Filled 2019-12-21 (×10): qty 50

## 2019-12-21 MED ORDER — MAGNESIUM SULFATE 4 GM/100ML IV SOLN
4.0000 g | Freq: Once | INTRAVENOUS | Status: AC
  Administered 2019-12-21: 4 g via INTRAVENOUS
  Filled 2019-12-21 (×2): qty 100

## 2019-12-21 MED ORDER — EPHEDRINE SULFATE 50 MG/ML IJ SOLN
INTRAMUSCULAR | Status: DC | PRN
  Administered 2019-12-21 (×2): 10 mg via INTRAVENOUS
  Administered 2019-12-21 (×3): 5 mg via INTRAVENOUS

## 2019-12-21 MED ORDER — CHLORHEXIDINE GLUCONATE CLOTH 2 % EX PADS
6.0000 | MEDICATED_PAD | Freq: Every day | CUTANEOUS | Status: DC
  Administered 2019-12-21 – 2019-12-23 (×3): 6 via TOPICAL

## 2019-12-21 MED ORDER — HYDROMORPHONE HCL 1 MG/ML IJ SOLN
1.0000 mg | INTRAMUSCULAR | Status: AC
  Administered 2019-12-21: 01:00:00 1 mg via INTRAVENOUS
  Filled 2019-12-21: qty 1

## 2019-12-21 MED ORDER — LACTATED RINGERS IV SOLN: INTRAVENOUS | Status: DC

## 2019-12-21 MED ORDER — ACETAMINOPHEN 10 MG/ML IV SOLN
INTRAVENOUS | Status: AC
  Filled 2019-12-21: qty 100

## 2019-12-21 MED ORDER — SUGAMMADEX SODIUM 200 MG/2ML IV SOLN
INTRAVENOUS | Status: DC | PRN
  Administered 2019-12-21: 400 mg via INTRAVENOUS

## 2019-12-21 MED ORDER — ONDANSETRON 4 MG PO TBDP
4.0000 mg | ORAL_TABLET | Freq: Four times a day (QID) | ORAL | Status: DC | PRN
  Filled 2019-12-21: qty 1

## 2019-12-21 MED ORDER — MIDAZOLAM HCL 2 MG/2ML IJ SOLN
INTRAMUSCULAR | Status: DC | PRN
  Administered 2019-12-21: 2 mg via INTRAVENOUS

## 2019-12-21 MED ORDER — SODIUM CHLORIDE 0.9 % IV BOLUS
1000.0000 mL | Freq: Once | INTRAVENOUS | Status: AC
  Administered 2019-12-21: 08:00:00 1000 mL via INTRAVENOUS

## 2019-12-21 MED ORDER — ROCURONIUM BROMIDE 50 MG/5ML IV SOLN
INTRAVENOUS | Status: AC
  Filled 2019-12-21: qty 1

## 2019-12-21 MED ORDER — PREGABALIN 75 MG PO CAPS
300.0000 mg | ORAL_CAPSULE | Freq: Two times a day (BID) | ORAL | Status: DC
  Administered 2019-12-21 – 2019-12-28 (×8): 300 mg via ORAL
  Filled 2019-12-21 (×11): qty 4

## 2019-12-21 MED ORDER — PHENYLEPHRINE HCL (PRESSORS) 10 MG/ML IV SOLN
INTRAVENOUS | Status: DC | PRN
  Administered 2019-12-21: 200 ug via INTRAVENOUS
  Administered 2019-12-21 (×2): 100 ug via INTRAVENOUS
  Administered 2019-12-21 (×3): 200 ug via INTRAVENOUS

## 2019-12-21 MED ORDER — HYDROCODONE-ACETAMINOPHEN 5-325 MG PO TABS: 1.0000 | ORAL_TABLET | ORAL | Status: DC | PRN

## 2019-12-21 MED ORDER — ZOLPIDEM TARTRATE 5 MG PO TABS: 5.0000 mg | ORAL_TABLET | Freq: Every evening | ORAL | Status: DC | PRN

## 2019-12-21 MED ORDER — PIPERACILLIN-TAZOBACTAM 3.375 G IVPB
3.3750 g | Freq: Three times a day (TID) | INTRAVENOUS | Status: DC
  Administered 2019-12-21 – 2019-12-28 (×22): 3.375 g via INTRAVENOUS
  Filled 2019-12-21 (×22): qty 50

## 2019-12-21 MED ORDER — BENZONATATE 100 MG PO CAPS
200.0000 mg | ORAL_CAPSULE | Freq: Three times a day (TID) | ORAL | Status: DC | PRN
  Administered 2019-12-25: 17:00:00 200 mg via ORAL
  Filled 2019-12-21: qty 2

## 2019-12-21 MED ORDER — MEPERIDINE HCL 50 MG/ML IJ SOLN: 6.2500 mg | INTRAMUSCULAR | Status: DC | PRN

## 2019-12-21 MED ORDER — PHENYLEPHRINE 40 MCG/ML (10ML) SYRINGE FOR IV PUSH (FOR BLOOD PRESSURE SUPPORT): 100.0000 ug | PREFILLED_SYRINGE | Freq: Once | INTRAVENOUS | Status: DC

## 2019-12-21 MED ORDER — PHENYLEPHRINE HCL (PRESSORS) 10 MG/ML IV SOLN: 0.1000 mg | Freq: Once | INTRAVENOUS | Status: DC

## 2019-12-21 MED ORDER — DEXTROSE 50 % IV SOLN
INTRAVENOUS | Status: AC
  Filled 2019-12-21: qty 50

## 2019-12-21 MED ORDER — ONDANSETRON HCL 4 MG/2ML IJ SOLN
4.0000 mg | INTRAMUSCULAR | Status: AC
  Administered 2019-12-21: 01:00:00 4 mg via INTRAVENOUS
  Filled 2019-12-21: qty 2

## 2019-12-21 MED ORDER — SODIUM PHOSPHATES 45 MMOLE/15ML IV SOLN
10.0000 mmol | Freq: Once | INTRAVENOUS | Status: DC
  Filled 2019-12-21: qty 3.33

## 2019-12-21 MED ORDER — SODIUM CHLORIDE 0.9% FLUSH
3.0000 mL | Freq: Two times a day (BID) | INTRAVENOUS | Status: DC
  Administered 2019-12-23 – 2019-12-27 (×4): 3 mL via INTRAVENOUS

## 2019-12-21 MED ORDER — DEXTROSE 50 % IV SOLN: 0.5000 | Freq: Once | INTRAVENOUS | Status: DC

## 2019-12-21 MED ORDER — ROCURONIUM BROMIDE 100 MG/10ML IV SOLN
INTRAVENOUS | Status: DC | PRN
  Administered 2019-12-21: 50 mg via INTRAVENOUS

## 2019-12-21 MED ORDER — ACETAMINOPHEN 325 MG PO TABS: 650.0000 mg | ORAL_TABLET | ORAL | Status: DC | PRN

## 2019-12-21 MED ORDER — SODIUM CHLORIDE 0.9 % IV BOLUS: 500.0000 mL | Freq: Once | INTRAVENOUS | Status: DC

## 2019-12-21 MED ORDER — PROPOFOL 10 MG/ML IV BOLUS
INTRAVENOUS | Status: DC | PRN
  Administered 2019-12-21: 150 mg via INTRAVENOUS

## 2019-12-21 MED ORDER — LAMOTRIGINE 100 MG PO TABS
200.0000 mg | ORAL_TABLET | Freq: Every day | ORAL | Status: DC
  Administered 2019-12-21 – 2019-12-27 (×4): 200 mg via ORAL
  Filled 2019-12-21 (×4): qty 2
  Filled 2019-12-21: qty 8

## 2019-12-21 MED ORDER — FENTANYL CITRATE (PF) 100 MCG/2ML IJ SOLN
25.0000 ug | INTRAMUSCULAR | Status: DC | PRN
  Administered 2019-12-21: 14:00:00 25 ug via INTRAVENOUS

## 2019-12-21 MED ORDER — SUCCINYLCHOLINE CHLORIDE 20 MG/ML IJ SOLN
INTRAMUSCULAR | Status: DC | PRN
  Administered 2019-12-21: 100 mg via INTRAVENOUS

## 2019-12-21 MED ORDER — ONDANSETRON HCL 4 MG/2ML IJ SOLN
4.0000 mg | Freq: Four times a day (QID) | INTRAMUSCULAR | Status: DC | PRN
  Filled 2019-12-21: qty 2

## 2019-12-21 MED ORDER — INSULIN ASPART 100 UNIT/ML ~~LOC~~ SOLN: 0.0000 [IU] | SUBCUTANEOUS | Status: DC

## 2019-12-21 MED ORDER — LACTATED RINGERS IV BOLUS
1000.0000 mL | Freq: Once | INTRAVENOUS | Status: AC
  Administered 2019-12-21: 01:00:00 1000 mL via INTRAVENOUS

## 2019-12-21 MED ORDER — KETOROLAC TROMETHAMINE 30 MG/ML IJ SOLN
30.0000 mg | Freq: Four times a day (QID) | INTRAMUSCULAR | Status: DC
  Administered 2019-12-21: 30 mg via INTRAVENOUS
  Filled 2019-12-21: qty 1

## 2019-12-21 MED ORDER — LACTATED RINGERS IV BOLUS
1000.0000 mL | Freq: Once | INTRAVENOUS | Status: AC
  Administered 2019-12-21: 1000 mL via INTRAVENOUS

## 2019-12-21 SURGICAL SUPPLY — 85 items
APPLICATOR COTTON TIP 6 STRL (MISCELLANEOUS) IMPLANT
APPLICATOR COTTON TIP 6IN STRL (MISCELLANEOUS)
APPLIER CLIP 5 13 M/L LIGAMAX5 (MISCELLANEOUS)
BLADE SURG SZ11 CARB STEEL (BLADE) ×3 IMPLANT
BULB RESERV EVAC DRAIN JP 100C (MISCELLANEOUS) ×3 IMPLANT
CANISTER SUCT 1200ML W/VALVE (MISCELLANEOUS) ×3 IMPLANT
CANISTER WOUND CARE 500ML ATS (WOUND CARE) ×3 IMPLANT
CHLORAPREP W/TINT 26 (MISCELLANEOUS) ×3 IMPLANT
CLIP APPLIE 5 13 M/L LIGAMAX5 (MISCELLANEOUS) IMPLANT
COVER WAND RF STERILE (DRAPES) ×3 IMPLANT
CUTTER FLEX LINEAR 45M (STAPLE) IMPLANT
DEFOGGER SCOPE WARMER CLEARIFY (MISCELLANEOUS) ×3 IMPLANT
DERMABOND ADVANCED (GAUZE/BANDAGES/DRESSINGS)
DERMABOND ADVANCED .7 DNX12 (GAUZE/BANDAGES/DRESSINGS) IMPLANT
DRAIN CHANNEL JP 19F (MISCELLANEOUS) ×3 IMPLANT
DRAPE LAPAROTOMY 100X77 ABD (DRAPES) ×3 IMPLANT
DRAPE UTILITY 15X26 TOWEL STRL (DRAPES) ×3 IMPLANT
DRSG TEGADERM 2-3/8X2-3/4 SM (GAUZE/BANDAGES/DRESSINGS) ×6 IMPLANT
DRSG TEGADERM 4X4.75 (GAUZE/BANDAGES/DRESSINGS) ×3 IMPLANT
DRSG TELFA 4X14 ISLAND NADH (GAUZE/BANDAGES/DRESSINGS) ×3 IMPLANT
DRSG TELFA 4X8 ISLAND PHMB (GAUZE/BANDAGES/DRESSINGS) IMPLANT
DRSG VAC ATS LRG SENSATRAC (GAUZE/BANDAGES/DRESSINGS) ×3 IMPLANT
ELECT CAUTERY BLADE 6.4 (BLADE) ×3 IMPLANT
ELECT CAUTERY BLADE TIP 2.5 (TIP)
ELECT EZSTD 165MM 6.5IN (MISCELLANEOUS)
ELECT REM PT RETURN 9FT ADLT (ELECTROSURGICAL) ×3
ELECTRODE CAUTERY BLDE TIP 2.5 (TIP) IMPLANT
ELECTRODE EZSTD 165MM 6.5IN (MISCELLANEOUS) IMPLANT
ELECTRODE REM PT RTRN 9FT ADLT (ELECTROSURGICAL) ×2 IMPLANT
GLOVE BIO SURGEON STRL SZ 6.5 (GLOVE) ×3 IMPLANT
GLOVE BIOGEL PI IND STRL 7.0 (GLOVE) ×2 IMPLANT
GLOVE BIOGEL PI INDICATOR 7.0 (GLOVE) ×1
GLOVE INDICATOR 7.0 STRL GRN (GLOVE) ×6 IMPLANT
GOWN STRL REUS W/ TWL LRG LVL3 (GOWN DISPOSABLE) ×4 IMPLANT
GOWN STRL REUS W/TWL LRG LVL3 (GOWN DISPOSABLE) ×2
GRADUATE 1200CC STRL 31836 (MISCELLANEOUS) ×3 IMPLANT
GRASPER SUT TROCAR 14GX15 (MISCELLANEOUS) IMPLANT
IRRIGATION STRYKERFLOW (MISCELLANEOUS) IMPLANT
IRRIGATOR STRYKERFLOW (MISCELLANEOUS)
IV NS 1000ML (IV SOLUTION) ×1
IV NS 1000ML BAXH (IV SOLUTION) ×2 IMPLANT
KIT TURNOVER KIT A (KITS) ×3 IMPLANT
KITTNER LAPARASCOPIC 5X40 (MISCELLANEOUS) IMPLANT
LABEL OR SOLS (LABEL) ×3 IMPLANT
LIGASURE LAP MARYLAND 5MM 37CM (ELECTROSURGICAL) IMPLANT
NEEDLE HYPO 22GX1.5 SAFETY (NEEDLE) ×3 IMPLANT
NS IRRIG 1000ML POUR BTL (IV SOLUTION) ×3 IMPLANT
NS IRRIG 500ML POUR BTL (IV SOLUTION) ×3 IMPLANT
PACK BASIN MAJOR ARMC (MISCELLANEOUS) IMPLANT
PACK COLON CLEAN CLOSURE (MISCELLANEOUS) ×3 IMPLANT
PACK LAP CHOLECYSTECTOMY (MISCELLANEOUS) ×3 IMPLANT
PENCIL ELECTRO HAND CTR (MISCELLANEOUS) ×3 IMPLANT
POUCH SPECIMEN RETRIEVAL 10MM (ENDOMECHANICALS) IMPLANT
RELOAD PROXIMATE 75MM BLUE (ENDOMECHANICALS) IMPLANT
RELOAD STAPLE TA45 3.5 REG BLU (ENDOMECHANICALS) IMPLANT
SCISSORS METZENBAUM CVD 33 (INSTRUMENTS) ×3 IMPLANT
SET TUBE SMOKE EVAC HIGH FLOW (TUBING) ×3 IMPLANT
SHEARS HARMONIC ACE PLUS 36CM (ENDOMECHANICALS) IMPLANT
SLEEVE ADV FIXATION 5X100MM (TROCAR) IMPLANT
SPONGE GAUZE 2X2 8PLY STRL LF (GAUZE/BANDAGES/DRESSINGS) ×3 IMPLANT
SPONGE LAP 18X18 RF (DISPOSABLE) ×9 IMPLANT
STAPLER PROXIMATE 75MM BLUE (STAPLE) IMPLANT
STAPLER SKIN PROX 35W (STAPLE) ×3 IMPLANT
STRIP CLOSURE SKIN 1/2X4 (GAUZE/BANDAGES/DRESSINGS) ×3 IMPLANT
SUT ETHILON 2 0 FS 18 (SUTURE) ×3 IMPLANT
SUT MNCRL 4-0 (SUTURE) ×1
SUT MNCRL 4-0 27XMFL (SUTURE) ×2
SUT PDS AB 1 TP1 54 (SUTURE) IMPLANT
SUT PDS AB 1 TP1 96 (SUTURE) ×6 IMPLANT
SUT SILK 2 0 (SUTURE) ×1
SUT SILK 2-0 18XBRD TIE 12 (SUTURE) ×2 IMPLANT
SUT SILK 3 0 (SUTURE) ×1
SUT SILK 3-0 (SUTURE) ×3 IMPLANT
SUT SILK 3-0 18XBRD TIE 12 (SUTURE) ×2 IMPLANT
SUT VIC AB 3-0 SH 27 (SUTURE) ×1
SUT VIC AB 3-0 SH 27X BRD (SUTURE) ×2 IMPLANT
SUT VICRYL 0 AB UR-6 (SUTURE) ×3 IMPLANT
SUTURE MNCRL 4-0 27XMF (SUTURE) ×2 IMPLANT
SYR 20ML LL LF (SYRINGE) IMPLANT
SYR 30ML LL (SYRINGE) IMPLANT
TRAY FOLEY MTR SLVR 16FR STAT (SET/KITS/TRAYS/PACK) ×3 IMPLANT
TROCAR 130MM GELPORT  DAV (MISCELLANEOUS) IMPLANT
TROCAR ADV FIXATION 12X100MM (TROCAR) IMPLANT
TROCAR BALLN GELPORT 12X130M (ENDOMECHANICALS) ×3 IMPLANT
TROCAR Z-THREAD OPTICAL 5X100M (TROCAR) IMPLANT

## 2019-12-21 SURGICAL SUPPLY — 51 items
APPLICATOR COTTON TIP 6 STRL (MISCELLANEOUS) ×1 IMPLANT
APPLICATOR COTTON TIP 6IN STRL (MISCELLANEOUS) ×2
APPLIER CLIP 5 13 M/L LIGAMAX5 (MISCELLANEOUS)
BLADE SURG SZ11 CARB STEEL (BLADE) ×2 IMPLANT
CANISTER SUCT 1200ML W/VALVE (MISCELLANEOUS) ×2 IMPLANT
CHLORAPREP W/TINT 26 (MISCELLANEOUS) ×2 IMPLANT
CLIP APPLIE 5 13 M/L LIGAMAX5 (MISCELLANEOUS) IMPLANT
COVER WAND RF STERILE (DRAPES) ×2 IMPLANT
CUTTER FLEX LINEAR 45M (STAPLE) ×2 IMPLANT
DEFOGGER SCOPE WARMER CLEARIFY (MISCELLANEOUS) ×2 IMPLANT
DERMABOND ADVANCED (GAUZE/BANDAGES/DRESSINGS) ×1
DERMABOND ADVANCED .7 DNX12 (GAUZE/BANDAGES/DRESSINGS) ×1 IMPLANT
ELECT CAUTERY BLADE 6.4 (BLADE) ×2 IMPLANT
ELECT CAUTERY BLADE TIP 2.5 (TIP) ×2
ELECT REM PT RETURN 9FT ADLT (ELECTROSURGICAL) ×2
ELECTRODE CAUTERY BLDE TIP 2.5 (TIP) ×1 IMPLANT
ELECTRODE REM PT RTRN 9FT ADLT (ELECTROSURGICAL) ×1 IMPLANT
GLOVE BIO SURGEON STRL SZ 6.5 (GLOVE) ×2 IMPLANT
GLOVE INDICATOR 7.0 STRL GRN (GLOVE) ×4 IMPLANT
GOWN STRL REUS W/ TWL LRG LVL3 (GOWN DISPOSABLE) ×2 IMPLANT
GOWN STRL REUS W/TWL LRG LVL3 (GOWN DISPOSABLE) ×2
GRASPER SUT TROCAR 14GX15 (MISCELLANEOUS) ×2 IMPLANT
IRRIGATION STRYKERFLOW (MISCELLANEOUS) ×1 IMPLANT
IRRIGATOR STRYKERFLOW (MISCELLANEOUS) ×2
IV NS 1000ML (IV SOLUTION) ×1
IV NS 1000ML BAXH (IV SOLUTION) ×1 IMPLANT
KIT TURNOVER KIT A (KITS) ×2 IMPLANT
KITTNER LAPARASCOPIC 5X40 (MISCELLANEOUS) ×2 IMPLANT
LABEL OR SOLS (LABEL) ×2 IMPLANT
LIGASURE LAP MARYLAND 5MM 37CM (ELECTROSURGICAL) IMPLANT
NEEDLE HYPO 22GX1.5 SAFETY (NEEDLE) ×2 IMPLANT
NS IRRIG 500ML POUR BTL (IV SOLUTION) ×2 IMPLANT
PACK LAP CHOLECYSTECTOMY (MISCELLANEOUS) ×2 IMPLANT
PENCIL ELECTRO HAND CTR (MISCELLANEOUS) ×2 IMPLANT
POUCH SPECIMEN RETRIEVAL 10MM (ENDOMECHANICALS) ×2 IMPLANT
RELOAD STAPLE TA45 3.5 REG BLU (ENDOMECHANICALS) ×2 IMPLANT
SCISSORS METZENBAUM CVD 33 (INSTRUMENTS) ×2 IMPLANT
SET TUBE SMOKE EVAC HIGH FLOW (TUBING) ×2 IMPLANT
SHEARS HARMONIC ACE PLUS 36CM (ENDOMECHANICALS) ×2 IMPLANT
SLEEVE ADV FIXATION 5X100MM (TROCAR) ×4 IMPLANT
STRIP CLOSURE SKIN 1/2X4 (GAUZE/BANDAGES/DRESSINGS) ×2 IMPLANT
SUT MNCRL 4-0 (SUTURE) ×1
SUT MNCRL 4-0 27XMFL (SUTURE) ×1
SUT VIC AB 3-0 SH 27 (SUTURE) ×1
SUT VIC AB 3-0 SH 27X BRD (SUTURE) ×1 IMPLANT
SUT VICRYL 0 AB UR-6 (SUTURE) ×2 IMPLANT
SUTURE MNCRL 4-0 27XMF (SUTURE) ×1 IMPLANT
TRAY FOLEY MTR SLVR 16FR STAT (SET/KITS/TRAYS/PACK) ×2 IMPLANT
TROCAR 130MM GELPORT  DAV (MISCELLANEOUS) ×2 IMPLANT
TROCAR ADV FIXATION 12X100MM (TROCAR) IMPLANT
TROCAR Z-THREAD OPTICAL 5X100M (TROCAR) ×2 IMPLANT

## 2019-12-21 NOTE — Anesthesia Preprocedure Evaluation (Signed)
Anesthesia Evaluation  Patient identified by MRN, date of birth, ID band Patient awake    Reviewed: Allergy & Precautions, H&P , NPO status , Patient's Chart, lab work & pertinent test results  Airway Mallampati: II  TM Distance: >3 FB Neck ROM: full   Comment: extremely large neck beard Dental  (+) Chipped   Pulmonary neg shortness of breath, asthma (childhood) ,    breath sounds clear to auscultation- rhonchi (-) wheezing      Cardiovascular (-) angina(-) Past MI and (-) Cardiac Stents negative cardio ROS  (-) dysrhythmias  Rhythm:Regular Rate:Tachycardia - Systolic murmurs and - Diastolic murmurs    Neuro/Psych negative neurological ROS  negative psych ROS   GI/Hepatic Neg liver ROS, GERD  Controlled,  Endo/Other  diabetes, Type 1Insulin pump  Renal/GU      Musculoskeletal   Abdominal (+) + obese,   Peds  Hematology negative hematology ROS (+)   Anesthesia Other Findings Abdominal obesity  Past Medical History: No date: Asthma     Comment:  AS A CHILD No date: Diabetes mellitus without complication (HCC)     Comment:  type one No date: GERD (gastroesophageal reflux disease)     Comment:  OCC NO MEDS  Past Surgical History: No date: NO PAST SURGERIES     Reproductive/Obstetrics negative OB ROS                             Anesthesia Physical  Anesthesia Plan  ASA: III  Anesthesia Plan: General ETT   Post-op Pain Management:    Induction: Rapid sequence, Cricoid pressure planned and Intravenous  PONV Risk Score and Plan: 1 and Ondansetron, Dexamethasone, Midazolam and Treatment may vary due to age or medical condition  Airway Management Planned: Video Laryngoscope Planned  Additional Equipment:   Intra-op Plan:   Post-operative Plan:   Informed Consent: I have reviewed the patients History and Physical, chart, labs and discussed the procedure including the risks,  benefits and alternatives for the proposed anesthesia with the patient or authorized representative who has indicated his/her understanding and acceptance.     Dental Advisory Given  Plan Discussed with: Anesthesiologist, CRNA and Surgeon  Anesthesia Plan Comments:         Anesthesia Quick Evaluation

## 2019-12-21 NOTE — ED Notes (Signed)
Pt taken to CT.

## 2019-12-21 NOTE — Progress Notes (Addendum)
[facility] SURGICAL PROGRESS NOTE  Hospital Day(s): 0.   Post op day(s): Day 1.   Interval History:  Patient seen and examined Admitted overnight for post-operative pain issues This morning, patient reports he continues to have worsening diffuse abdominal pain which is back to a 10 out of 10 and reports feeling markedly distended Additionally, he has also become increasingly tachycardic and hypotensive despite aggressive IVF resuscitation Lactic acidosis 3.6 --> 4.5 --> re-check this morning pending Leukopenia to 2.0, afebrile sCr worsening --> 1.72; U/O - 200 recorded Hypomagnesemia, Hypophosphatemia present as well He has been NPO since prior to presentation   Vital signs in last 24 hours: [min-max] current  Temp:  [97.1 F (36.2 C)-98.9 F (37.2 C)] 98.9 F (37.2 C) (01/29 0448) Pulse Rate:  [103-121] 115 (01/29 0448) Resp:  [13-29] 20 (01/29 0448) BP: (82-126)/(53-85) 98/67 (01/29 0448) SpO2:  [86 %-100 %] 93 % (01/29 0448) Weight:  [81.6 kg] 81.6 kg (01/28 2120)     Height: 5\' 1"  (154.9 cm) Weight: 81.6 kg BMI (Calculated): 34.03   Intake/Output last 2 shifts:  01/28 0701 - 01/29 0700 In: 2000 [IV Piggyback:2000] Out: 200 [Urine:200]   Physical Exam:  Constitutional: alert, appears uncomfortable, laying still in stretcher Respiratory: breathing non-labored at rest, on [place]  Cardiovascular: tachycardic and sinus rhythm  Gastrointestinal: soft, diffusely tender to light palpation, markedly distended, + rebound Integumentary: Laparoscopic incisions are CDI with steri-strips, no erythema or drainage   Labs:  CBC Latest Ref Rng & Units 12/21/2019 12/20/2019 11/16/2019  WBC 4.0 - 10.5 K/uL 2.0(L) 2.1(L) 7.2  Hemoglobin 13.0 - 17.0 g/dL 11/18/2019 32.2 11.6(L)  Hematocrit 39.0 - 52.0 % 41.2 48.2 33.8(L)  Platelets 150 - 400 K/uL 160 189 287   CMP Latest Ref Rng & Units 12/21/2019 12/20/2019 11/16/2019  Glucose 70 - 99 mg/dL 11/18/2019) 427(C) 623(J)  BUN 6 - 20  mg/dL 628(B) 19 8  Creatinine 0.61 - 1.24 mg/dL 15(V) 7.61(Y) 0.73(X  Sodium 135 - 145 mmol/L 139 137 139  Potassium 3.5 - 5.1 mmol/L 4.1 4.7 3.8  Chloride 98 - 111 mmol/L 111 106 104  CO2 22 - 32 mmol/L 22 21(L) 25  Calcium 8.9 - 10.3 mg/dL 7.6(L) 8.5(L) 8.4(L)  Total Protein 6.5 - 8.1 g/dL - 7.1 -  Total Bilirubin 0.3 - 1.2 mg/dL - 1.3(H) -  Alkaline Phos 38 - 126 U/L - 69 -  AST 15 - 41 U/L - 50(H) -  ALT 0 - 44 U/L - 40 -     Imaging studies:   CT Abdomen/Pelvis (12/21/2019) personally reviewed showing moderate amount of pneumoperitoneum consistent with recent surgery and fluid in the lower abdomen/pelvis, and radiologist report reviewed below:  IMPRESSION: Postoperative changes from earlier appendectomy. Moderate free air and free fluid in the abdomen and pelvis, presumably related to recent postoperative state.   Cholelithiasis.   Bibasilar atelectasis.  Assessment/Plan: 29 y.o. male with increasing abdominal pain, worsening hemodynamics with lactic acidosis and declining renal function who was found to have pneumoperitoneum and free fluid in his abdomen which is difficult to assess for source given he is 1 Day s/p interval Laparoscopic Appendectomy for appendicitis.    - Given his radiographic and PE findings, decline in hemodynamic status, increasing lactic acidosis, and worsening renal function, I do think it is in his best interest to go to the OR for diagnostic laparoscopy and possible exploratory laparotomy. I discussed the risks, benefits, and alternatives to this including but not limited to the  need for possible bowel resection, having a negative laparotomy, needing to leave drains. All his question and concerns were addressed and answered and he wishes to proceed. Informed consent obtained.    - Continue aggressive IVF resuscitation + boluses  - Start IV Abx (Zosyn)  - Pain control; Discontinued Toradol given AKI; Avoid further nephrotoxins   - STAT re-check lactic  acidosis   - Closely monitor abdominal examination; on-going bowel function  - May need foley given worsening AKI and low UO  - Will ask pharmacy to assist with electrolyte management  - Medical management of comorbidities    All of the above findings and recommendations were discussed with the patient, and the medical team, and all of patient's questions were answered to his expressed satisfaction.  -- Edison Simon, PA-C [facility] 12/21/2019, 7:13 AM [phone] M-F: 7am - 4pm  I saw and evaluated the patient.  I agree with the above documentation, exam, and plan, which I have edited where appropriate. Proceeding directly to the OR this morning. Fredirick Maudlin  11:08 AM

## 2019-12-21 NOTE — Transfer of Care (Signed)
Immediate Anesthesia Transfer of Care Note  Patient: Richard [name]  Procedure(s) Performed: LAPAROSCOPY DIAGNOSTIC (N/A ) EXPLORATORY LAPAROTOMY (N/A ) APPLICATION OF WOUND VAC  Patient Location: PACU  Anesthesia Type:General  Level of Consciousness: sedated  Airway & Oxygen Therapy: Patient Spontanous Breathing and Patient connected to face mask oxygen  Post-op Assessment: Report given to RN and Post -op Vital signs reviewed and stable  Post vital signs: Reviewed and stable  Last Vitals:  Vitals Value Taken Time  BP 95/57 12/21/19 1120  Temp 35.9 C 12/21/19 1120  Pulse 115 12/21/19 1124  Resp 11 12/21/19 1124  SpO2 92 % 12/21/19 1124  Vitals shown include unvalidated device data.  Last Pain:  Vitals:   12/21/19 0909  TempSrc:   PainSc: 9          Complications: No apparent anesthesia complications

## 2019-12-21 NOTE — Progress Notes (Signed)
SURGERY FOLLOW UP NOTE  Patient was reevaluated at this moment.  Patient reports that the pain intensified again.  Pain now is 9 out of 10.  Patient cannot identify specific area of the abdomen with pain.  Patient denies any nausea at this moment.  There is no pain radiation.  There is no alleviating or aggravating factor.  Blood pressure 98/67, pulse (!) 115, temperature 98.9 F (37.2 C), temperature source Oral, resp. rate 20, height 5\' 1"  (1.549 m), weight 81.6 kg, SpO2 93 %.  General: Alert, oriented x3 Abdomen: Distended, tender to palpation, no guarding  Assessment and plan:  Patient reports having abdominal pain.  Patient with persistent tachycardia.  This has been persistent since last admission.  There was an increasing lactic acid.  I will give a liter of normal saline.  I will repeat labs including CBC, basic metabolic panel and electrolytes.  I discussed with the patient that if he does not respond to the Toradol that was given and he continue with worsening labs and physical exam will need to consider surgical management.  Patient understood and agreed with plan.  This follow-up encounter was more than 30-minute most of the time counseling the patient and coordinating plan of care.  , MD

## 2019-12-21 NOTE — Progress Notes (Signed)
Pharmacy Electrolyte Monitoring Consult:  Pharmacy consulted to assist in monitoring and replacing electrolytes in this 29 y.o. male admitted on 12/20/2019 with Post-op Problem 1/28 s/p lap appendectomy   Labs:  Sodium (mmol/L)  Date Value  12/21/2019 139   Potassium (mmol/L)  Date Value  12/21/2019 4.1   Magnesium (mg/dL)  Date Value  27/63/9432 1.1 (L)   Phosphorus (mg/dL)  Date Value  00/37/9444 2.2 (L)   Calcium (mg/dL)  Date Value  61/90/1222 7.6 (L)   Albumin (g/dL)  Date Value  41/14/6431 4.0   SCr 1.45 >1.72 Currently NPO  Assessment/Plan: K 4.1, Mag 1.1, Phos 2.2   Will order Mag sulfate 4 g IV x1, NaPhos 10 mmol IV x1 Recheck labs in AM  Pharmacy will continue to follow.   Marty Heck 12/21/2019 8:24 AM

## 2019-12-21 NOTE — Progress Notes (Signed)
I secured chat Dr. Lady Gary to let her know that Richard [name] has a BP of 77/51 after the 3 boluses and 40 mcg of neo and asked if she thought he was a candidate for ICU and she agreed and she secured chatted Dr. Belia Heman concerning him being sent there and he agreed so I am making preparation to send him to the unit.

## 2019-12-21 NOTE — Op Note (Signed)
Operative Note  Preoperative Diagnosis: Acute abdomen status post laparoscopic appendectomy  Postoperative Diagnosis: Same, secondary to apparent thermal injury at the terminal ileum  Operation: Diagnostic laparoscopy, exploratory laparotomy, repair of small bowel enterotomy, placement of abdominal wound VAC  Surgeon: Duanne Guess, MD  Assistant: Lynden Oxford, PA-C (Mr. Schulz's assistance was necessary for adequate exposure of the surgical site)  Anesthesia: GETA  Findings: Upon evaluation with the laparoscope, there was extensive exudate, dilated erythematous bowels, and yellow-green staining present.  Upon opening the abdomen and exploring, there were approximately 800 cc of frank small bowel contents present.  Thorough evaluation of the abdomen, paying primary attention to the small intestine, demonstrated a tiny perforation at the terminal ileum, suggestive of a thermal injury.  The staple line was intact at the site of the appendectomy.  Indications: Richard [name] is a 29 year old man who had perforated appendicitis at the end of December 2020.  He presented yesterday for an interval appendectomy.  The operation was somewhat technically challenging, secondary to the patient's body habitus, but appeared to proceed in an uncomplicated fashion.  Harmonic scalpel was used to free adhesions from his prior perforation.  He was discharged to home in good condition.  During the night, he developed severe abdominal pain, tachycardia, hypotension, and abdominal distention.  He presented to the emergency department where a CT scan showed only an expected amount of pneumoperitoneum but a fair amount of intra-abdominal fluid.  He continued to clinically deteriorate and upon evaluation this morning, it was felt that he required an urgent return to the operating room.  Consent was obtained and we proceeded expeditiously.  Procedure In Detail: The patient was identified in the preoperative holding  area and brought to the operating room where he was placed supine on the OR table.  Bony prominences were padded and bilateral sequential compression devices were placed on the lower extremities.  General endotracheal anesthesia was induced without incident.  A Foley catheter and nasogastric tube were placed.  The patient was then sterilely prepped and draped in standard fashion.  A timeout was performed confirming his identity, the procedure being performed, his allergies, all necessary equipment was available, that maintenance anesthesia was adequate.  A perioperative dose of antibiotics was administered.  I began by reopening the periumbilical port site.  The fascial suture was clipped and a 10 mm trocar inserted.  The abdomen was insufflated.  The laparoscope was introduced and immediately it became clear that there was a perforation of some sort, due to the erythematous distended small bowel, presence of light yellow and pale green staining and particulate matter.  The scope and trocar with were withdrawn.  The incision was enlarged.  As the fascia was opened, copious enteric contents spewed forth.  This was all suctioned away.  Once the flow of fluid had slowed, we began to explore the abdomen.  Due to the patient's body habitus and very short thick mesentery, this was somewhat challenging.  I was able to identify the ligament of Treitz and carefully ran the small bowel.  No injury was appreciated until I reached the terminal ileum.  There was a small hole essentially at the junction of the terminal ileum and cecum, from which yellow-green succus was flowing.  This area was isolated and further examined.  It appeared to be consistent with a thermal injury.  The area was quite small and the surrounding tissue appeared healthy.  I therefore elected to close it primarily with multiple interrupted 3-0 Lembert sutures.  I tested the repair by squeezing the small intestine and cecum gently.  There was no further  effluent.  I elected to place a #19 Blake drain around the repair.  It was brought out through the left lateral trocar site from yesterday's procedure.  We then irrigated the abdomen with 4 L of saline ensuring that the area around the liver stomach and spleen were included in our irrigant.  We continued until the irrigant ran completely clear.  The placement of the nasogastric tube was confirmed manually and it was secured in place by anesthesia.  The fascia was closed with #1 looped PDS.  Exparel was in infiltrated along the fascial planes.  The drain was secured in place with a nylon suture.  Due to the degree of contamination as well as the patient's obesity, we elected to leave the skin open.  A black granular foam wound VAC was trimmed to an appropriate size and placed with good suction.  The remaining trocar sites were covered with a clean dressing.  The patient was then awakened, extubated, and taken to the postanesthesia care unit in stable condition.  EBL: Less than 5 cc  IVF: See anesthesia record  Specimen(s): None  Complications: none immediately apparent.   Counts: all needles, instruments, and sponges were counted and reported to be correct in number at the end of the case.   I was present for and participated in the entire operation.  Fredirick Maudlin 11:16 AM

## 2019-12-21 NOTE — Progress Notes (Signed)
Preoperative Diagnosis: Acute abdomen status post laparoscopic appendectomy  Postoperative Diagnosis: Same, secondary to apparent thermal injury at the terminal ileum  Operation: Diagnostic laparoscopy, exploratory laparotomy, repair of small bowel enterotomy, placement of abdominal wound VAC   CHIEF COMPLAINT:   Chief Complaint  Patient presents with  . Post-op Problem    Subjective  Post op exp lap Developed sepsis and shock post op Alert and awake, NAD Mild pain and mild SOB Responding to IVF's      Review of Systems:  Gen:  Denies  fever, sweats, chills weight loss  HEENT: Denies blurred vision, double vision, ear pain, eye pain, hearing loss, nose bleeds, sore throat Cardiac:  No dizziness, chest pain or heaviness, chest tightness,edema, No JVD Resp:   No cough, -sputum production, -shortness of breath,-wheezing, -hemoptysis,  Other:  All other systems negative   Objective   Examination:  General exam: Appears calm and comfortable  Respiratory system: Clear to auscultation. Respiratory effort normal. HEENT: [place]/AT, PERRLA, no thrush, no stridor. Cardiovascular system: S1 & S2 heard, RRR. No JVD, murmurs, rubs, gallops or clicks. No pedal edema. Gastrointestinal system: Abdomen is distended, no bowel sounds heard, wound vac in place Central nervous system: Alert and oriented. No focal neurological deficits. Extremities: Symmetric 5 x 5 power. Skin: No rashes, lesions or ulcers Psychiatry: Judgement and insight appear normal. Mood & affect appropriate.   VITALS:  height is 5\' 1"  (1.549 m) and weight is 72.4 kg. His oral temperature is 97.7 F (36.5 C). His blood pressure is 86/60 (abnormal) and his pulse is 116 (abnormal). His respiration is 20 and oxygen saturation is 90%.   I personally reviewed Labs under Results section.  Radiology Reports CT ABDOMEN PELVIS W CONTRAST  Result Date: 12/21/2019 CLINICAL DATA:  Lower abdominal pain following appendectomy  earlier today EXAM: CT ABDOMEN AND PELVIS WITH CONTRAST TECHNIQUE: Multidetector CT imaging of the abdomen and pelvis was performed using the standard protocol following bolus administration of intravenous contrast. CONTRAST:  12/23/2019 OMNIPAQUE IOHEXOL 300 MG/ML  SOLN COMPARISON:  11/16/2019 FINDINGS: Lower chest: Linear areas of subsegmental atelectasis in the lower lobes. Hepatobiliary: Small layering gallstones within the gallbladder. No focal hepatic abnormality. Pancreas: No focal abnormality or ductal dilatation. Spleen: No focal abnormality.  Normal size. Adrenals/Urinary Tract: No adrenal abnormality. No focal renal abnormality. No stones or hydronephrosis. Urinary bladder is unremarkable. Stomach/Bowel: Postoperative changes from appendectomy. Stranding in some wall thickening noted in the region of the cecum, likely related to today's surgery. No evidence of bowel obstruction. Stomach and small bowel decompressed, unremarkable. Moderate stool in the colon. Vascular/Lymphatic: No evidence of aneurysm or adenopathy. Reproductive: No visible focal abnormality. Other: Moderate free air and free fluid noted in the abdomen and pelvis, with fluid most pronounced in the right lower quadrant, cul-de-sac and adjacent to the liver. Musculoskeletal: No acute bony abnormality. IMPRESSION: Postoperative changes from earlier appendectomy. Moderate free air and free fluid in the abdomen and pelvis, presumably related to recent postoperative state. Cholelithiasis. Bibasilar atelectasis. Electronically Signed   By: 11/18/2019 M.D.   On: 12/21/2019 01:03       Assessment/Plan:    S/p appendicitis s/p ex lap thermal injury at terminal ileum Post op hypovolumic shock and sepsis responding well to IVF's and ABX  ELECTROLYTES -follow labs as needed -replace as needed -pharmacy consultation and following  DVT/GI PRX ordered TRANSFUSIONS AS NEEDED MONITOR FSBS ASSESS the need for LABS as needed   12/23/2019, M.D.  [facility]  Medical Director [place] Director [facility] Cardio-Pulmonary Department

## 2019-12-21 NOTE — H&P (Signed)
History of Present Illness Richard [name] is a 29 y.o. male past medical history of diabetes mellitus type 1, anxiety. Patient is status post laparoscopic appendectomy yesterday around 4 PM.  Reported that upon discharge she was feeling well but when he got home he started to have worsening abdominal pain.  I received a call from him reporting that he was having pain 11 out of 10 even after taking the hydrocodone.  I recommended the patient to come to the ED for further evaluation.  At the ED labs shows leukopenia with normal hemoglobin.  The creatinine is 1.45.  Previous creatinine was 1.06.  CT scan of the abdomen was done showing moderate amount of pneumoperitoneum and free fluid.  At the moment of my evaluation the patient reports that the pain has been improving since he got here.  Of note, when he call before coming to the ED the pain was 11/10, upon arrival to ED the pain is reported as 9/10 and at the moment of my evaluation the patient is saying that the pain is 6/10.  The alleviating factor has been the pain medications at the ED.  There is no aggravating factor.  There is no pain radiation.  There is no localized pain in the abdomen, it is just generalized.  The patient endorses nausea but no vomiting.  He reported that he is not passing rectal gas.  Past Medical History Past Medical History:  Diagnosis Date  . Asthma    AS A CHILD  . Diabetes mellitus without complication ([place])    type one  . GERD (gastroesophageal reflux disease)    OCC NO MEDS      Past Surgical History:  Procedure Laterality Date  . APPENDECTOMY    . NO PAST SURGERIES      Allergies  Allergen Reactions  . Sulfa Antibiotics Anaphylaxis    Current Facility-Administered Medications  Medication Dose Route Frequency Provider Last Rate Last Admin  . 0.9 %  sodium chloride infusion   Intravenous Continuous Herbert Pun, MD      . ARIPiprazole (ABILIFY) tablet 30 mg  30 mg Oral QHS Herbert Pun, MD      . DULoxetine (CYMBALTA) DR capsule 90 mg  90 mg Oral QHS Herbert Pun, MD      . enoxaparin (LOVENOX) injection 40 mg  40 mg Subcutaneous Q24H Herbert Pun, MD   40 mg at 12/21/19 0338  . HYDROcodone-acetaminophen (NORCO/VICODIN) 5-325 MG per tablet 1-2 tablet  1-2 tablet Oral Q4H PRN Herbert Pun, MD      . HYDROmorphone (DILAUDID) injection 1 mg  1 mg Intravenous Q3H PRN Herbert Pun, MD      . ketorolac (TORADOL) 30 MG/ML injection 30 mg  30 mg Intravenous Q6H Cintron-Diaz, Reeves Forth, MD      . lamoTRIgine (LAMICTAL) tablet 200 mg  200 mg Oral QHS Herbert Pun, MD      . ondansetron (ZOFRAN-ODT) disintegrating tablet 4 mg  4 mg Oral Q6H PRN Herbert Pun, MD       Or  . ondansetron (ZOFRAN) injection 4 mg  4 mg Intravenous Q6H PRN Herbert Pun, MD      . pregabalin (LYRICA) capsule 300 mg  300 mg Oral BID Herbert Pun, MD      . propranolol ER (INDERAL LA) 24 hr capsule 60 mg  60 mg Oral QHS Cintron-Diaz, Reeves Forth, MD      . sodium chloride flush (NS) 0.9 % injection 3 mL  3 mL Intravenous Once Cintron-Diaz,  Lurline Idol, MD      . tiaGABine (GABITRIL) tablet 8 mg  8 mg Oral QHS Carolan Shiver, MD      . zolpidem (AMBIEN) tablet 5 mg  5 mg Oral QHS PRN Carolan Shiver, MD       Current Outpatient Medications  Medication Sig Dispense Refill  . ARIPiprazole (ABILIFY) 30 MG tablet Take 30 mg by mouth at bedtime.     . DULoxetine (CYMBALTA) 30 MG capsule Take 90 mg by mouth at bedtime.     Marland Kitchen HUMALOG 100 UNIT/ML injection BASAL RATE 1.25 UNITS/HR    . HYDROcodone-acetaminophen (NORCO/VICODIN) 5-325 MG tablet Take 1 tablet by mouth every 6 (six) hours as needed for moderate pain. 15 tablet 0  . lamoTRIgine (LAMICTAL) 100 MG tablet Take 200 mg by mouth at bedtime.     . ondansetron (ZOFRAN ODT) 4 MG disintegrating tablet Take 1 tablet (4 mg total) by mouth every 8 (eight) hours as needed for nausea or  vomiting. 20 tablet 0  . pregabalin (LYRICA) 300 MG capsule Take 300 mg by mouth 2 (two) times daily.    . propranolol ER (INDERAL LA) 60 MG 24 hr capsule Take 60 mg by mouth at bedtime.     . tiaGABine (GABITRIL) 4 MG tablet Take 8 mg by mouth at bedtime.    Marland Kitchen zolpidem (AMBIEN) 10 MG tablet Take 10 mg by mouth at bedtime as needed.    . Insulin Glargine (LANTUS) 100 UNIT/ML Solostar Pen Off-pump: Lantus/Levemir or Basaglar pen: 22 u as soon as off pump/ea.24 hrs off pump. Restart pump after 20 hrs from last longacting shot.      Family History Family History  Problem Relation Age of Onset  . Diabetes Mother   . Heart disease Mother   . Diabetes Father       Social History Social History   Tobacco Use  . Smoking status: Never Smoker  . Smokeless tobacco: Never Used  Substance Use Topics  . Alcohol use: Not Currently  . Drug use: Never       ROS Full ROS of systems performed and is otherwise negative there than what is stated in the HPI  Physical Exam Blood pressure 98/64, pulse (!) 114, temperature 98.4 F (36.9 C), temperature source Oral, resp. rate (!) 23, height 5\' 1"  (1.549 m), weight 81.6 kg, SpO2 94 %.  CONSTITUTIONAL: In pain, alert, oriented x3 EYES: Pupils equal, round, and reactive to light, Sclera non-icteric. EARS, NOSE, MOUTH AND THROAT: The oropharynx is clear. Oral mucosa is pink and moist. Hearing is intact to voice.  NECK: Trachea is midline, and there is no jugular venous distension. Thyroid is without palpable abnormalities. LYMPH NODES:  Lymph nodes in the neck are not enlarged. RESPIRATORY:  Lungs are clear, and breath sounds are equal bilaterally. Normal respiratory effort without pathologic use of accessory muscles. CARDIOVASCULAR: Heart is regular without murmurs, gallops, or rubs. GI: The abdomen is soft, moderate tender in all quadrants, and distended. There were no palpable masses. There was no hepatosplenomegaly. There is no bowel sounds. GU:  Normal inguinal and scrotum. MUSCULOSKELETAL:  Normal muscle strength and tone in all four extremities.    SKIN: Skin turgor is normal. There are no pathologic skin lesions.  NEUROLOGIC:  Motor and sensation is grossly normal.  Cranial nerves are grossly intact. PSYCH:  Alert and oriented to person, place and time. Affect is normal.  Data Reviewed I personally evaluated the labs and CT scan of the abdominal pelvis.  CBC    Component Value Date/Time   WBC 2.1 (L) 12/20/2019 2202   RBC 5.23 12/20/2019 2202   HGB 15.9 12/20/2019 2202   HCT 48.2 12/20/2019 2202   PLT 189 12/20/2019 2202   MCV 92.2 12/20/2019 2202   MCH 30.4 12/20/2019 2202   MCHC 33.0 12/20/2019 2202   RDW 12.9 12/20/2019 2202   LYMPHSABS 1.3 11/13/2019 1838   MONOABS 1.2 (H) 11/13/2019 1838   EOSABS 0.0 11/13/2019 1838   BASOSABS 0.0 11/13/2019 1838   Lactic Acid, Venous    Component Value Date/Time   LATICACIDVEN 3.6 (HH) 12/21/2019 0042   CMP Latest Ref Rng & Units 12/20/2019 11/16/2019 11/14/2019  Glucose 70 - 99 mg/dL 409(W) 119(J) 47(W)  BUN 6 - 20 mg/dL 19 8 13   Creatinine 0.61 - 1.24 mg/dL ) 2.95(A 2.13  Sodium 135 - 145 mmol/L 137 139 137  Potassium 3.5 - 5.1 mmol/L 4.7 3.8 3.3(L)  Chloride 98 - 111 mmol/L 106 104 102  CO2 22 - 32 mmol/L 21(L) 25 26  Calcium 8.9 - 10.3 mg/dL 0.86) 5.7(Q) 7.7(L)  Total Protein 6.5 - 8.1 g/dL 7.1 - 6.6  Total Bilirubin 0.3 - 1.2 mg/dL 4.6(N) - 0.7  Alkaline Phos 38 - 126 U/L 69 - 120  AST 15 - 41 U/L 50(H) - 34  ALT 0 - 44 U/L 40 - 29   Assessment    Patient with severe postoperative pain.  Differential diagnoses are pain coming from the CO2 pneumoperitoneum during laparoscopic surgery.  Patient may also be developing an ileus which can cause significant pain since she is not passing gas.  Also a consideration could be leak from the staple line.  The fact that the patient reports that the pain has been improving since he got here and now is 6 out of 10 is a  good sign.  Patient responding adequately to hydration and pain medication.  He is getting more comfortable slowly.  At this moment I will see any indication to take patient emergently to the OR but I discussed with him that if the pain intensifies or there is any other sign concerning of anastomosis leak.  The patient is understanding and agreeable.  Plan    Admit to observation and pain management We will keep n.p.o. for tonight until pain is improved We will watch out for possible ileus IV hydration No indication for antibiotic therapy at this moment We will repeat labs in the morning Patient encouraged to ambulate as the pain improves DVT prophylaxis  Face-to-face time spent with the patient and care providers was 45 minutes, with more than 50% of the time spent counseling, educating, and coordinating care of the patient.    6.2(X 12/21/2019, 3:44 AM

## 2019-12-21 NOTE — Progress Notes (Addendum)
Called Dr. Henrene Hawking concerning the patient's BP of 86/56 after normal saline boluses times three. He stated ICU could fix the BP, blood glucose of 80, and O2 of 91 on 4L.

## 2019-12-21 NOTE — Anesthesia Procedure Notes (Signed)
Procedure Name: Intubation Date/Time: 12/21/2019 9:32 AM Performed by: Rosanne Gutting, CRNA Pre-anesthesia Checklist: Patient identified, Patient being monitored, Timeout performed, Emergency Drugs available and Suction available Patient Re-evaluated:Patient Re-evaluated prior to induction Oxygen Delivery Method: Circle system utilized Preoxygenation: Pre-oxygenation with 100% oxygen Induction Type: IV induction, Rapid sequence and Cricoid Pressure applied Ventilation: Unable to mask ventilate Laryngoscope Size: 3, McGraph and 4 Grade View: Grade II Tube type: Oral Tube size: 7.5 mm Number of attempts: 1 Airway Equipment and Method: Stylet and Video-laryngoscopy Placement Confirmation: ETT inserted through vocal cords under direct vision,  positive ETCO2 and breath sounds checked- equal and bilateral Secured at: 22 cm Tube secured with: Tape Dental Injury: Teeth and Oropharynx as per pre-operative assessment

## 2019-12-21 NOTE — Progress Notes (Signed)
Pharmacy Antibiotic Note  Richard [name] is a 29 y.o. male admitted on 12/20/2019 with intra-abdominal infection.  Pharmacy has been consulted for Zosyn dosing.  Plan: Zosyn 3.375g IV q8h (4 hour infusion).  Height: 5\' 1"  (154.9 cm) Weight: 180 lb (81.6 kg) IBW/kg (Calculated) : 52.3  Temp (24hrs), Avg:97.9 F (36.6 C), Min:97.1 F (36.2 C), Max:98.9 F (37.2 C)  Recent Labs  Lab 12/20/19 2202 12/21/19 0042 12/21/19 0339 12/21/19 0541  WBC 2.1*  --   --  2.0*  CREATININE 1.45*  --   --  1.72*  LATICACIDVEN  --  3.6* 4.5*  --     Estimated Creatinine Clearance: 58.9 mL/min (A) (by C-G formula based on SCr of 1.72 mg/dL (H)).    Allergies  Allergen Reactions  . Sulfa Antibiotics Anaphylaxis    Antimicrobials this admission: Zosyn 1/29 >>  Dose adjustments this admission:   Microbiology results: 1/29 BCx: NGTD x2  Thank you for allowing pharmacy to be a part of this patient's care.  2/29 12/21/2019 8:23 AM

## 2019-12-21 NOTE — ED Provider Notes (Signed)
[facility] Emergency Department Provider Note  ____________________________________________   First MD Initiated Contact with Patient 12/21/19 0002     (approximate)  I have reviewed the triage vital signs and the nursing notes.   HISTORY  Chief Complaint Post-op Problem    HPI Richard [name] is a 29 y.o. male who had a laparoscopic appendectomy earlier today by Dr. Lady Garyannon after complicated course for the last month that included perforated appendicitis with abscess.  He presents tonight with severe and rapidly worsening pain over the last few hours.  He says that he felt okay when he is in the hospital and he got home around 430.  By 630 the pain was starting to come back.  He tried taking oral pain medicine but it did not help and the pain is steadily gotten worse and is now at the point that he says he cannot stand it.  He has had persistent severe nausea and has been unable to eat or drink anything though he has not been actively vomiting.  He said he had a normal bowel movement about 2 hours ago but other than that he has not been passing gas of which she is aware.  He denies fevers.  The pain is bad enough that it is making it difficult for him to breathe and he feels like his abdomen is distended.  Any amount of movement makes his symptoms worse and nothing particular makes it better.         Past Medical History:  Diagnosis Date  . Asthma    AS A CHILD  . Diabetes mellitus without complication (HCC)    type one  . GERD (gastroesophageal reflux disease)    OCC NO MEDS    Patient Active Problem List   Diagnosis Date Noted  . Post-operative pain 12/21/2019  . Acute perforated appendicitis 11/15/2019  . Appendicitis with abscess 11/13/2019    Past Surgical History:  Procedure Laterality Date  . APPENDECTOMY    . NO PAST SURGERIES      Prior to Admission medications   Medication Sig Start Date End Date Taking? Authorizing Provider    ARIPiprazole (ABILIFY) 30 MG tablet Take 30 mg by mouth at bedtime.  10/29/19  Yes [provider]  DULoxetine (CYMBALTA) 30 MG capsule Take 90 mg by mouth at bedtime.  10/25/19  Yes [provider]  HUMALOG 100 UNIT/ML injection BASAL RATE 1.25 UNITS/HR 11/07/19  Yes [provider]  HYDROcodone-acetaminophen (NORCO/VICODIN) 5-325 MG tablet Take 1 tablet by mouth every 6 (six) hours as needed for moderate pain. 12/20/19  Yes Duanne Guessannon, Jennifer, MD  lamoTRIgine (LAMICTAL) 100 MG tablet Take 200 mg by mouth at bedtime.  10/29/19  Yes [provider]  ondansetron (ZOFRAN ODT) 4 MG disintegrating tablet Take 1 tablet (4 mg total) by mouth every 8 (eight) hours as needed for nausea or vomiting. 11/27/19  Yes Duanne Guessannon, Jennifer, MD  pregabalin (LYRICA) 300 MG capsule Take 300 mg by mouth 2 (two) times daily. 11/07/19  Yes [provider]  propranolol ER (INDERAL LA) 60 MG 24 hr capsule Take 60 mg by mouth at bedtime.  11/07/19  Yes [provider]  tiaGABine (GABITRIL) 4 MG tablet Take 8 mg by mouth at bedtime. 10/30/19  Yes [provider]  zolpidem (AMBIEN) 10 MG tablet Take 10 mg by mouth at bedtime as needed. 07/10/19  Yes [provider]  Insulin Glargine (LANTUS) 100 UNIT/ML Solostar Pen Off-pump: Lantus/Levemir or Basaglar pen: 22  u as soon as off pump/ea.24 hrs off pump. Restart pump after 20 hrs from last longacting shot. 04/30/19   [provider]    Allergies Sulfa antibiotics  Family History  Problem Relation Age of Onset  . Diabetes Mother   . Heart disease Mother   . Diabetes Father     Social History Social History   Tobacco Use  . Smoking status: Never Smoker  . Smokeless tobacco: Never Used  Substance Use Topics  . Alcohol use: Not Currently  . Drug use: Never    Review of Systems Constitutional: No fever/chills Eyes: No visual changes. ENT: No sore throat. Cardiovascular: Denies chest  pain. Respiratory: Denies shortness of breath. Gastrointestinal: Severe abdominal pain and distention and nausea.  Normal bowel movement about 2 hours ago, unsure of passage of gas. Genitourinary: Negative for dysuria. Musculoskeletal: Negative for neck pain.  Negative for back pain. Integumentary: Negative for rash. Neurological: Negative for headaches, focal weakness or numbness.   ____________________________________________   PHYSICAL EXAM:  VITAL SIGNS: ED Triage Vitals  Enc Vitals Group     BP 12/20/19 2119 104/69     Pulse Rate 12/20/19 2119 (!) 119     Resp 12/20/19 2119 (!) 22     Temp 12/20/19 2119 98.4 F (36.9 C)     Temp Source 12/20/19 2119 Oral     SpO2 12/20/19 2119 97 %     Weight 12/20/19 2120 81.6 kg (180 lb)     Height 12/20/19 2120 1.549 m (5\' 1" )     Head Circumference --      Peak Flow --      Pain Score 12/20/19 2120 9     Pain Loc --      Pain Edu? --      Excl. in GC? --     Constitutional: Alert and oriented.  Appears to be extremely uncomfortable. Eyes: Conjunctivae are normal.  Head: Atraumatic. Nose: No congestion/rhinnorhea. Mouth/Throat: Patient is wearing a mask. Neck: No stridor.  No meningeal signs.   Cardiovascular: Tachycardia in the 120s, regular rhythm. Good peripheral circulation. Grossly normal heart sounds. Respiratory: The patient seems to be splinting due to pain but otherwise has no respiratory complications. Gastrointestinal: Abdomen is tight and distended.  Severe tenderness to palpation all throughout the abdomen concerning for generalized peritonitis.  Normal-appearing surgical incision, no dehiscence. Musculoskeletal: No lower extremity tenderness nor edema. No gross deformities of extremities. Neurologic:  Normal speech and language. No gross focal neurologic deficits are appreciated.  Skin:  Skin is warm, dry and intact. Psychiatric: Mood and affect are normal. Speech and behavior are  normal.  ____________________________________________   LABS (all labs ordered are listed, but only abnormal results are displayed)  Labs Reviewed  COMPREHENSIVE METABOLIC PANEL - Abnormal; Notable for the following components:      Result Value   CO2 21 (*)    Glucose, Bld 252 (*)    Creatinine, Ser 1.45 (*)    Calcium 8.5 (*)    AST 50 (*)    Total Bilirubin 1.3 (*)    All other components within normal limits  CBC - Abnormal; Notable for the following components:   WBC 2.1 (*)    All other components within normal limits  LACTIC ACID, PLASMA - Abnormal; Notable for the following components:   Lactic Acid, Venous 3.6 (*)    All other components within normal limits  LACTIC ACID, PLASMA - Abnormal; Notable for the following components:   Lactic Acid,  Venous 4.5 (*)    All other components within normal limits  CULTURE, BLOOD (ROUTINE X 2)  CULTURE, BLOOD (ROUTINE X 2)  LIPASE, BLOOD  PROCALCITONIN  URINALYSIS, COMPLETE (UACMP) WITH MICROSCOPIC   ____________________________________________  EKG  None - EKG not ordered by ED physician ____________________________________________  RADIOLOGY Marylou Mccoy, personally viewed and evaluated these images (plain radiographs) as part of my medical decision making, as well as reviewing the written report by the radiologist.  ED MD interpretation:  Free air and free fluid, likely secondary to recent surgery.  Official radiology report(s): CT ABDOMEN PELVIS W CONTRAST  Result Date: 12/21/2019 CLINICAL DATA:  Lower abdominal pain following appendectomy earlier today EXAM: CT ABDOMEN AND PELVIS WITH CONTRAST TECHNIQUE: Multidetector CT imaging of the abdomen and pelvis was performed using the standard protocol following bolus administration of intravenous contrast. CONTRAST:  OMNIPAQUE IOHEXOL 300 MG/ML  SOLN COMPARISON:  11/16/2019 FINDINGS: Lower chest: Linear areas of subsegmental atelectasis in the lower lobes.  Hepatobiliary: Small layering gallstones within the gallbladder. No focal hepatic abnormality. Pancreas: No focal abnormality or ductal dilatation. Spleen: No focal abnormality.  Normal size. Adrenals/Urinary Tract: No adrenal abnormality. No focal renal abnormality. No stones or hydronephrosis. Urinary bladder is unremarkable. Stomach/Bowel: Postoperative changes from appendectomy. Stranding in some wall thickening noted in the region of the cecum, likely related to today's surgery. No evidence of bowel obstruction. Stomach and small bowel decompressed, unremarkable. Moderate stool in the colon. Vascular/Lymphatic: No evidence of aneurysm or adenopathy. Reproductive: No visible focal abnormality. Other: Moderate free air and free fluid noted in the abdomen and pelvis, with fluid most pronounced in the right lower quadrant, cul-de-sac and adjacent to the liver. Musculoskeletal: No acute bony abnormality. IMPRESSION: Postoperative changes from earlier appendectomy. Moderate free air and free fluid in the abdomen and pelvis, presumably related to recent postoperative state. Cholelithiasis. Bibasilar atelectasis. Electronically Signed   By: Charlett Nose M.D.   On: 12/21/2019 01:03    ____________________________________________   PROCEDURES   Procedure(s) performed (including Critical Care):  Procedures   ____________________________________________   INITIAL IMPRESSION / MDM / ASSESSMENT AND PLAN / ED COURSE  As part of my medical decision making, I reviewed the following data within the electronic MEDICAL RECORD NUMBER Nursing notes reviewed and incorporated, Labs reviewed , Old chart reviewed, A consult was requested and obtained from this/these consultant(s) Surgery and Notes from prior ED visits   Differential diagnosis includes, but is not limited to, normal postoperative pain, postoperative bleeding with phlegmon or fluid collection, perforated bowel or wound dehiscence within the abdomen,  peritonitis, acute infection, SBO/ileus.  The patient is extremely uncomfortable and has a distended and severely tender abdomen.  Is difficult to know whether this could be due to retained gas from the laparoscopic procedure and normal postoperative pain or whether he is suffering from an SBO or more concerning postoperative complication.  While he was in triage, a CT with oral and IV contrast was ordered but he is not able to tolerate any oral contrast at this time.  He is in severe pain in spite of receiving morphine 2 mg IV earlier.  I have ordered Dilaudid 1 mg IV, Zofran 4 mg IV, and 1 L of lactated Ringer's.  I have added on a lactic acid and procalcitonin.  I talked with Pam the CT technologist and asked her to go ahead and scan him with IV contrast only.  I will touch base with surgery at the next available opportunity given  that the patient is so recently postoperative.  I have asked the patient to remain n.p.o.  He understands and agrees with the plan.      Clinical Course as of Dec 20 414  Fri Dec 21, 5359  4431 Metabolic panel is notable for creatinine of 1.4.  This is not a substantial change from his baseline which seems to be around 1.1, but is likely reflective of decreased volume due to poor oral intake today.  As previously stated he is getting 1 L of lactated Ringer as a bolus.  His CBC is notable for a slight leukopenia of 2.1 which is of unclear clinical significance.  Comprehensive metabolic panel(!) [CF]  5400 Procalcitonin is elevated at 1.5 and the patient's lactic acid is 3.6.  However this could be volume related and secondary to his respirations.  However it could be acute infection as well.  I have ordered a second liter of lactated Ringer's and I ordered blood cultures.  I called and spoke with Dr. Peyton Najjar who is on-call for surgery.  I explained the situation and asked him to come see the patient which she will do.  I asked if I should go ahead and give antibiotics and he  asked me to hold on antibiotics at this time.  Dr. Peyton Najjar will review the CT scan and come see the patient in person.   [CF]  0155 I discussed the case with Dr. Peyton Najjar in person after he evaluated the patient.  He will admit the patient for observation but does not feel this is representative of acute infection.  He agrees with the fluids and would like to hold off on antibiotics.  He agrees with repeating the lactic acid.  Patient feels better after the Dilaudid and is stable at this time.   [CF]    Clinical Course User Index [CF] Hinda Kehr, MD     ____________________________________________  FINAL CLINICAL IMPRESSION(S) / ED DIAGNOSES  Final diagnoses:  Post-operative pain  Nausea  Abdominal distention     MEDICATIONS GIVEN DURING THIS VISIT:  Medications  sodium chloride flush (NS) 0.9 % injection 3 mL (3 mLs Intravenous Not Given 12/21/19 0049)  propranolol ER (INDERAL LA) 24 hr capsule 60 mg (has no administration in time range)  ARIPiprazole (ABILIFY) tablet 30 mg (has no administration in time range)  DULoxetine (CYMBALTA) DR capsule 90 mg (has no administration in time range)  zolpidem (AMBIEN) tablet 5 mg (has no administration in time range)  lamoTRIgine (LAMICTAL) tablet 200 mg (has no administration in time range)  pregabalin (LYRICA) capsule 300 mg (has no administration in time range)  tiaGABine (GABITRIL) tablet 8 mg (has no administration in time range)  enoxaparin (LOVENOX) injection 40 mg (40 mg Subcutaneous Given 12/21/19 0338)  0.9 %  sodium chloride infusion ( Intravenous New Bag/Given 12/21/19 0412)  ketorolac (TORADOL) 30 MG/ML injection 30 mg (has no administration in time range)  HYDROcodone-acetaminophen (NORCO/VICODIN) 5-325 MG per tablet 1-2 tablet (has no administration in time range)  HYDROmorphone (DILAUDID) injection 1 mg (has no administration in time range)  ondansetron (ZOFRAN-ODT) disintegrating tablet 4 mg (has no administration in time  range)    Or  ondansetron (ZOFRAN) injection 4 mg (has no administration in time range)  ondansetron (ZOFRAN) injection 4 mg (4 mg Intravenous Given 12/20/19 2157)  morphine 2 MG/ML injection 2 mg (2 mg Intravenous Not Given 12/20/19 2159)  morphine 2 MG/ML injection 2 mg (2 mg Intravenous Given 12/20/19 2201)  lactated ringers bolus  1,000 mL (0 mLs Intravenous Stopped 12/21/19 0337)  HYDROmorphone (DILAUDID) injection 1 mg (1 mg Intravenous Given 12/21/19 0038)  ondansetron (ZOFRAN) injection 4 mg (4 mg Intravenous Given 12/21/19 0038)  iohexol (OMNIPAQUE) 300 MG/ML solution 100 mL (100 mLs Intravenous Contrast Given 12/21/19 0049)  lactated ringers bolus 1,000 mL (0 mLs Intravenous Stopped 12/21/19 5409)     ED Discharge Orders    None      *Please note:  Zethan Alfieri was evaluated in Emergency Department on 12/21/2019 for the symptoms described in the history of present illness. He was evaluated in the context of the global COVID-19 pandemic, which necessitated consideration that the patient might be at risk for infection with the SARS-CoV-2 virus that causes COVID-19. Institutional protocols and algorithms that pertain to the evaluation of patients at risk for COVID-19 are in a state of rapid change based on information released by regulatory bodies including the CDC and federal and state organizations. These policies and algorithms were followed during the patient's care in the ED.  Some ED evaluations and interventions may be delayed as a result of limited staffing during the pandemic.*  Note:  This document was prepared using Dragon voice recognition software and may include unintentional dictation errors.   Loleta Rose, MD 12/21/19 [phone]

## 2019-12-22 ENCOUNTER — Inpatient Hospital Stay: Payer: Medicare Other

## 2019-12-22 LAB — BASIC METABOLIC PANEL
Anion gap: 6 (ref 5–15)
BUN: 26 mg/dL — ABNORMAL HIGH (ref 6–20)
CO2: 24 mmol/L (ref 22–32)
Calcium: 7.1 mg/dL — ABNORMAL LOW (ref 8.9–10.3)
Chloride: 111 mmol/L (ref 98–111)
Creatinine, Ser: 1.29 mg/dL — ABNORMAL HIGH (ref 0.61–1.24)
GFR calc Af Amer: >60 mL/min (ref >60)
GFR calc non Af Amer: >60 mL/min (ref >60)
Glucose, Bld: 150 mg/dL — ABNORMAL HIGH (ref 70–99)
Potassium: 5.3 mmol/L — ABNORMAL HIGH (ref 3.5–5.1)
Sodium: 141 mmol/L (ref 135–145)

## 2019-12-22 LAB — GLUCOSE, CAPILLARY
Glucose-Capillary: 156 mg/dL — ABNORMAL HIGH (ref 70–99)
Glucose-Capillary: 58 mg/dL — ABNORMAL LOW (ref 70–99)
Glucose-Capillary: 97 mg/dL (ref 70–99)
Glucose-Capillary: 135 mg/dL — ABNORMAL HIGH (ref 70–99)
Glucose-Capillary: 57 mg/dL — ABNORMAL LOW (ref 70–99)
Glucose-Capillary: 42 mg/dL — CL (ref 70–99)
Glucose-Capillary: 93 mg/dL (ref 70–99)
Glucose-Capillary: 115 mg/dL — ABNORMAL HIGH (ref 70–99)
Glucose-Capillary: 48 mg/dL — ABNORMAL LOW (ref 70–99)
Glucose-Capillary: 91 mg/dL (ref 70–99)

## 2019-12-22 LAB — CBC
HCT: 40.1 % (ref 39.0–52.0)
Hemoglobin: 13 g/dL (ref 13.0–17.0)
MCH: 31 pg (ref 26.0–34.0)
MCHC: 32.4 g/dL (ref 30.0–36.0)
MCV: 95.5 fL (ref 80.0–100.0)
Platelets: 154 10*3/uL (ref 150–400)
RBC: 4.2 MIL/uL — ABNORMAL LOW (ref 4.22–5.81)
RDW: 13.6 % (ref 11.5–15.5)
WBC: 4.3 10*3/uL (ref 4.0–10.5)
nRBC: 0 % (ref 0.0–0.2)

## 2019-12-22 LAB — BLOOD GAS, ARTERIAL
Acid-base deficit: 2.8 mmol/L — ABNORMAL HIGH (ref 0.0–2.0)
Bicarbonate: 23.2 mmol/L (ref 20.0–28.0)
FIO2: 0.36
O2 Saturation: 95 %
Patient temperature: 37
pCO2 arterial: 44 mmHg (ref 32.0–48.0)
pH, Arterial: 7.33 — ABNORMAL LOW (ref 7.350–7.450)
pO2, Arterial: 81 mmHg — ABNORMAL LOW (ref 83.0–108.0)

## 2019-12-22 LAB — PHOSPHORUS: Phosphorus: 3.7 mg/dL (ref 2.5–4.6)

## 2019-12-22 LAB — MAGNESIUM: Magnesium: 2.4 mg/dL (ref 1.7–2.4)

## 2019-12-22 LAB — LACTIC ACID, PLASMA: Lactic Acid, Venous: 1.2 mmol/L (ref 0.5–1.9)

## 2019-12-22 MED ORDER — DEXTROSE 50 % IV SOLN
INTRAVENOUS | Status: AC
  Administered 2019-12-22: 20:00:00 25 mL
  Filled 2019-12-22: qty 50

## 2019-12-22 MED ORDER — STERILE WATER FOR INJECTION IV SOLN
INTRAVENOUS | Status: AC
  Filled 2019-12-22 (×3): qty 850

## 2019-12-22 MED ORDER — DEXTROSE 50 % IV SOLN
INTRAVENOUS | Status: AC
  Administered 2019-12-22: 25 mL
  Filled 2019-12-22: qty 50

## 2019-12-22 MED ORDER — DEXTROSE 50 % IV SOLN
1.0000 | Freq: Once | INTRAVENOUS | Status: AC
  Administered 2019-12-24: 20:00:00 50 mL via INTRAVENOUS
  Filled 2019-12-22: qty 50

## 2019-12-22 MED ORDER — DEXTROSE 50 % IV SOLN: 12.5000 g | INTRAVENOUS | Status: AC

## 2019-12-22 MED ORDER — DEXTROSE 50 % IV SOLN
INTRAVENOUS | Status: AC
  Filled 2019-12-22: qty 50

## 2019-12-22 MED ORDER — DEXTROSE 10 % IV SOLN: INTRAVENOUS | Status: DC

## 2019-12-22 MED ORDER — DEXTROSE 50 % IV SOLN
INTRAVENOUS | Status: AC
  Administered 2019-12-22: 50 mL via INTRAVENOUS
  Filled 2019-12-22: qty 50

## 2019-12-22 NOTE — Anesthesia Postprocedure Evaluation (Signed)
Anesthesia Post Note  Patient: Richard [name]  Procedure(s) Performed: LAPAROSCOPY DIAGNOSTIC (N/A ) EXPLORATORY LAPAROTOMY (N/A ) APPLICATION OF WOUND VAC SMALL BOWEL REPAIR  Patient location during evaluation: ICU Anesthesia Type: General Level of consciousness: awake, awake and alert and oriented Pain management: pain level controlled Vital Signs Assessment: post-procedure vital signs reviewed and stable Respiratory status: spontaneous breathing Cardiovascular status: blood pressure returned to baseline Postop Assessment: no apparent nausea or vomiting     Last Vitals:  Vitals:   12/22/19 0500 12/22/19 0600  BP: 126/75 115/64  Pulse: (!) 123 (!) 123  Resp: (!) 28 17  Temp:    SpO2: 98% 97%    Last Pain:  Vitals:   12/22/19 0500  TempSrc:   PainSc: 9                  Brittay Mogle

## 2019-12-22 NOTE — Progress Notes (Signed)
Subjective:  CC: Richard [name] is a 30 y.o. male  Hospital stay day 1, 1 Day Post-Op  Diagnostic laparoscopy, exploratory laparotomy, repair of small bowel enterotomy, placement of abdominal wound VAC  HPI:  No acute issues overnight.  Did not require pressors during ICU observation.  Transfer to floor now.  Patient states pain is well controlled.  Denies any flatus or BM.  ROS:  General: Denies weight loss, weight gain, fatigue, fevers, chills, and night sweats. Heart: Denies chest pain, palpitations, racing heart, irregular heartbeat, leg pain or swelling, and decreased activity tolerance. Respiratory: Denies breathing difficulty, shortness of breath, wheezing, cough, and sputum. GI: Denies change in appetite, heartburn, nausea, vomiting, constipation, diarrhea, and blood in stool. GU: Denies difficulty urinating, pain with urinating, urgency, frequency, blood in urine.   Objective:   Temp:  [98 F (36.7 C)-99.3 F (37.4 C)] 99.3 F (37.4 C) (01/30 0800) Pulse Rate:  [117-123] 117 (01/30 1500) Resp:  [6-28] 20 (01/30 1500) BP: (102-127)/(63-83) 122/66 (01/30 1500) SpO2:  [87 %-98 %] 97 % (01/30 1500)     Height: 5\' 1"  (154.9 cm) Weight: 72.4 kg BMI (Calculated): 30.17   Intake/Output this shift:   Intake/Output Summary (Last 24 hours) at 12/22/2019 1726 Last data filed at 12/22/2019 1531 Gross per 24 hour  Intake 4552.61 ml  Output 2215 ml  Net 2337.61 ml    Constitutional :  alert, cooperative, appears stated age and no distress  Respiratory:  clear to auscultation bilaterally  Cardiovascular:  regular rate and rhythm  Gastrointestinal: Soft, no guarding, slight distention.  Wound VAC intact with light serosanguineous output.  JP drain in right lower quadrant with some light bilious drainage..   Skin: Cool and moist.  Slight erythema noted at the inferior port site.  No obvious drainage.  Psychiatric: Normal affect, non-agitated, not confused       LABS:  CMP Latest  Ref Rng & Units 12/22/2019 12/21/2019 12/20/2019  Glucose 70 - 99 mg/dL 12/22/2019) 366(Y) 403(K)  BUN 6 - 20 mg/dL 742(V) 95(G) 19  Creatinine 0.61 - 1.24 mg/dL 38(V) 5.64(P) 3.29(J)  Sodium 135 - 145 mmol/L 141 139 137  Potassium 3.5 - 5.1 mmol/L 5.3(H) 4.1 4.7  Chloride 98 - 111 mmol/L 111 111 106  CO2 22 - 32 mmol/L 24 22 21(L)  Calcium 8.9 - 10.3 mg/dL 7.1(L) 7.6(L) 8.5(L)  Total Protein 6.5 - 8.1 g/dL - - 7.1  Total Bilirubin 0.3 - 1.2 mg/dL - - 1.3(H)  Alkaline Phos 38 - 126 U/L - - 69  AST 15 - 41 U/L - - 50(H)  ALT 0 - 44 U/L - - 40   CBC Latest Ref Rng & Units 12/22/2019 12/21/2019 12/20/2019  WBC 4.0 - 10.5 K/uL 4.3 2.0(L) 2.1(L)  Hemoglobin 13.0 - 17.0 g/dL 12/22/2019 16.6 06.3  Hematocrit 39.0 - 52.0 % 40.1 41.2 48.2  Platelets 150 - 400 K/uL 154 160 189    RADS: n/a Assessment:   S/p  Diagnostic laparoscopy, exploratory laparotomy, repair of small bowel enterotomy, placement of abdominal wound VAC after lap appy.  Stable for now.  Persistent tachycardia but patient remains calm with minimal distress.  We will need to continue to monitor.  Discontinue Foley.  Continue NG to low intermittent suction until return of bowel function.  Patient is a type I diabetic on insulin pump.  He states that he is okay with continuing with glucose sticks and not being on his typical CGM.

## 2019-12-22 NOTE — Progress Notes (Signed)
Hypoglycemic Event  CBG: 48  Treatment: D50 25 mL (12.5 gm)   Symptoms: None  Follow-up CBG: Time: 2027 CBG Result:115  Possible Reasons for Event: Inadequate meal intake (Pt is NPO & Type 1 DM)  Comments/MD notified: Manuela Schwartz, NP    Elenor Quinones

## 2019-12-22 NOTE — Progress Notes (Signed)
Received call from Dr. Belia Heman in regard to transfer of care of Mr. Richard [name] to [facility] for medical follow-up. Case rerviewed by Dr. Belia Heman: patient with post-op complications leading to transfer to ICU for IVF. Did not require pressors. Is stable and can be transferred to medical service for on-going consult.  [facility] will pick-up patient in the AM. Illene Regulus, MD [facility] [phone]

## 2019-12-22 NOTE — Progress Notes (Signed)
Pharmacy Antibiotic Note  Richard [name] is a 29 y.o. male admitted on 12/20/2019 with intra-abdominal infection.  Pharmacy has been consulted for Zosyn dosing.  Plan: Day 2- Zosyn 3.375g IV q8h (4 hour infusion).    Height: 5\' 1"  (154.9 cm) Weight: 159 lb 9.8 oz (72.4 kg) IBW/kg (Calculated) : 52.3  Temp (24hrs), Avg:98.3 F (36.8 C), Min:97.4 F (36.3 C), Max:99.3 F (37.4 C)  Recent Labs  Lab 12/20/19 2202 12/21/19 0042 12/21/19 0339 12/21/19 0541 12/21/19 0738 12/22/19 0427  WBC 2.1*  --   --  2.0*  --  4.3  CREATININE 1.45*  --   --  1.72*  --  1.29*  LATICACIDVEN  --  3.6* 4.5*  --  2.8*  --     Estimated Creatinine Clearance: 74 mL/min (A) (by C-G formula based on SCr of 1.29 mg/dL (H)).    Allergies  Allergen Reactions  . Sulfa Antibiotics Anaphylaxis    Antimicrobials this admission: Zosyn 1/29 >>  Dose adjustments this admission:   Microbiology results: 1/29 BCx: NGTD x2  Thank you for allowing pharmacy to be a part of this patient's care.  Taysean Wager A 12/22/2019 11:24 AM

## 2019-12-22 NOTE — Progress Notes (Addendum)
Preoperative Diagnosis: Acute abdomen status post laparoscopic appendectomy  Postoperative Diagnosis: Same, secondary to apparent thermal injury at the terminal ileum  Operation: Diagnostic laparoscopy, exploratory laparotomy, repair of small bowel enterotomy, placement of abdominal wound VAC   CHIEF COMPLAINT:   Chief Complaint  Patient presents with  . Post-op Problem    Subjective   Shock resolved with IVF's Alert and awake       Review of Systems:  Gen:  Denies  fever, sweats, chills weight loss  HEENT: Denies blurred vision, double vision, ear pain, eye pain, hearing loss, nose bleeds, sore throat Cardiac:  No dizziness, chest pain or heaviness, chest tightness,edema, No JVD Resp:   No cough, -sputum production, -shortness of breath,-wheezing, -hemoptysis,  Gi+pain and distention Other:  All other systems negative   Objective   Physical Examination:   GENERAL:NAD, no fevers, chills, no weakness no fatigue HEAD: Normocephalic, atraumatic.  EYES: PERLA, EOMI No scleral icterus.  MOUTH: Moist mucosal membrane.  EAR, NOSE, THROAT: Clear without exudates. No external lesions.  NECK: Supple. No thyromegaly.  No JVD.  PULMONARY: CTA B/L no wheezing, rhonchi, crackles CARDIOVASCULAR: S1 and S2. Regular rate and rhythm. No murmurs GASTROINTESTINAL: +distended, wound vac in place, decreased BS MUSCULOSKELETAL: No swelling, clubbing, or edema.  NEUROLOGIC: No gross focal neurological deficits. 5/5 strength all extremities SKIN: No ulceration, lesions, rashes, or cyanosis.  PSYCHIATRIC: Insight, judgment intact. -depression -anxiety ALL OTHER ROS ARE NEGATIVE       VITALS:  height is 5\' 1"  (1.549 m) and weight is 72.4 kg. His oral temperature is 98.9 F (37.2 C). His blood pressure is 115/64 and his pulse is 123 (abnormal). His respiration is 17 and oxygen saturation is 97%.   I personally reviewed Labs under Results section.  Radiology Reports CT ABDOMEN  PELVIS W CONTRAST  Result Date: 12/21/2019 CLINICAL DATA:  Lower abdominal pain following appendectomy earlier today EXAM: CT ABDOMEN AND PELVIS WITH CONTRAST TECHNIQUE: Multidetector CT imaging of the abdomen and pelvis was performed using the standard protocol following bolus administration of intravenous contrast. CONTRAST:  143mL OMNIPAQUE IOHEXOL 300 MG/ML  SOLN COMPARISON:  11/16/2019 FINDINGS: Lower chest: Linear areas of subsegmental atelectasis in the lower lobes. Hepatobiliary: Small layering gallstones within the gallbladder. No focal hepatic abnormality. Pancreas: No focal abnormality or ductal dilatation. Spleen: No focal abnormality.  Normal size. Adrenals/Urinary Tract: No adrenal abnormality. No focal renal abnormality. No stones or hydronephrosis. Urinary bladder is unremarkable. Stomach/Bowel: Postoperative changes from appendectomy. Stranding in some wall thickening noted in the region of the cecum, likely related to today's surgery. No evidence of bowel obstruction. Stomach and small bowel decompressed, unremarkable. Moderate stool in the colon. Vascular/Lymphatic: No evidence of aneurysm or adenopathy. Reproductive: No visible focal abnormality. Other: Moderate free air and free fluid noted in the abdomen and pelvis, with fluid most pronounced in the right lower quadrant, cul-de-sac and adjacent to the liver. Musculoskeletal: No acute bony abnormality. IMPRESSION: Postoperative changes from earlier appendectomy. Moderate free air and free fluid in the abdomen and pelvis, presumably related to recent postoperative state. Cholelithiasis. Bibasilar atelectasis. Electronically Signed   By: Rolm Baptise M.D.   On: 12/21/2019 01:03   DG Chest Port 1 View  Result Date: 12/22/2019 CLINICAL DATA:  Asthma.  Follow-up. EXAM: PORTABLE CHEST 1 VIEW COMPARISON:  11/13/2019.  CT abdomen done yesterday. FINDINGS: Nasogastric tube enters the abdomen. Slight worsening of linear atelectatic changes in the  lungs, particularly the lung bases. No lobar collapse. No visible effusion. No  pneumothorax. IMPRESSION: Slight worsening of postoperative atelectatic changes. Electronically Signed   By: Paulina Fusi M.D.   On: 12/22/2019 06:54       Assessment/Plan:    S/p appendicitis s/p ex lap thermal injury at terminal ileum Post op hypovolumic shock and sepsis responding well to IVF's and ABX Shock resolved  ELECTROLYTES -follow labs as needed -replace as needed -pharmacy consultation and following    DVT/GI PRX ordered TRANSFUSIONS AS NEEDED MONITOR FSBS ASSESS the need for LABS as needed    Lucie Leather, M.D.  [facility]  Medical Director [facility] Medical Director [facility] Cardio-Pulmonary Department

## 2019-12-22 NOTE — Progress Notes (Addendum)
Pharmacy Electrolyte Monitoring Consult:  Pharmacy consulted to assist in monitoring and replacing electrolytes in this 29 y.o. male admitted on 12/20/2019 with Post-op Problem 1/28 s/p lap appendectomy   Labs:  Sodium (mmol/L)  Date Value  12/22/2019 141   Potassium (mmol/L)  Date Value  12/22/2019 5.3 (H)   Magnesium (mg/dL)  Date Value  79/43/2761 2.4   Phosphorus (mg/dL)  Date Value  47/07/2956 3.7   Calcium (mg/dL)  Date Value  47/34/0370 7.1 (L)   Albumin (g/dL)  Date Value  96/43/8381 4.0   SCr 1.45 >1.72>1.29 Currently NPO except fo rmeds  Assessment/Plan: K 5.3, Mag 2.4, Phos 3.7  Scr 1.29   Sodium Phos was not given yesterday per MAR? Labs ok today though.  Recheck labs in AM  Pharmacy will continue to follow.   Jude Linck A 12/22/2019 11:25 AM

## 2019-12-22 NOTE — Progress Notes (Signed)
Patient foley removed per order, patient tolerated well.

## 2019-12-23 DIAGNOSIS — R110 Nausea: Secondary | ICD-10-CM

## 2019-12-23 LAB — CBC WITH DIFFERENTIAL/PLATELET
Abs Immature Granulocytes: 0.02 10*3/uL (ref 0.00–0.07)
Basophils Absolute: 0.1 10*3/uL (ref 0.0–0.1)
Basophils Relative: 2 %
Eosinophils Absolute: 0 10*3/uL (ref 0.0–0.5)
Eosinophils Relative: 0 %
HCT: 35.9 % — ABNORMAL LOW (ref 39.0–52.0)
Hemoglobin: 11.7 g/dL — ABNORMAL LOW (ref 13.0–17.0)
Immature Granulocytes: 0 %
Lymphocytes Relative: 17 %
Lymphs Abs: 0.9 10*3/uL (ref 0.7–4.0)
MCH: 31 pg (ref 26.0–34.0)
MCHC: 32.6 g/dL (ref 30.0–36.0)
MCV: 95 fL (ref 80.0–100.0)
Monocytes Absolute: 0.3 10*3/uL (ref 0.1–1.0)
Monocytes Relative: 5 %
Neutro Abs: 4.2 10*3/uL (ref 1.7–7.7)
Neutrophils Relative %: 76 %
Platelets: 179 10*3/uL (ref 150–400)
RBC: 3.78 MIL/uL — ABNORMAL LOW (ref 4.22–5.81)
RDW: 13.7 % (ref 11.5–15.5)
Smear Review: NORMAL
WBC: 5.5 10*3/uL (ref 4.0–10.5)
nRBC: 0 % (ref 0.0–0.2)

## 2019-12-23 LAB — MAGNESIUM: Magnesium: 2.3 mg/dL (ref 1.7–2.4)

## 2019-12-23 LAB — BASIC METABOLIC PANEL
Anion gap: 6 (ref 5–15)
BUN: 24 mg/dL — ABNORMAL HIGH (ref 6–20)
CO2: 29 mmol/L (ref 22–32)
Calcium: 7.9 mg/dL — ABNORMAL LOW (ref 8.9–10.3)
Chloride: 106 mmol/L (ref 98–111)
Creatinine, Ser: 1.31 mg/dL — ABNORMAL HIGH (ref 0.61–1.24)
GFR calc Af Amer: >60 mL/min (ref >60)
GFR calc non Af Amer: >60 mL/min (ref >60)
Glucose, Bld: 157 mg/dL — ABNORMAL HIGH (ref 70–99)
Potassium: 4.2 mmol/L (ref 3.5–5.1)
Sodium: 141 mmol/L (ref 135–145)

## 2019-12-23 LAB — HEMOGLOBIN AND HEMATOCRIT, BLOOD
HCT: 34.3 % — ABNORMAL LOW (ref 39.0–52.0)
Hemoglobin: 11.3 g/dL — ABNORMAL LOW (ref 13.0–17.0)

## 2019-12-23 LAB — GLUCOSE, CAPILLARY
Glucose-Capillary: 130 mg/dL — ABNORMAL HIGH (ref 70–99)
Glucose-Capillary: 132 mg/dL — ABNORMAL HIGH (ref 70–99)
Glucose-Capillary: 135 mg/dL — ABNORMAL HIGH (ref 70–99)
Glucose-Capillary: 141 mg/dL — ABNORMAL HIGH (ref 70–99)
Glucose-Capillary: 151 mg/dL — ABNORMAL HIGH (ref 70–99)
Glucose-Capillary: 167 mg/dL — ABNORMAL HIGH (ref 70–99)
Glucose-Capillary: 85 mg/dL (ref 70–99)

## 2019-12-23 LAB — PHOSPHORUS: Phosphorus: 1.6 mg/dL — ABNORMAL LOW (ref 2.5–4.6)

## 2019-12-23 MED ORDER — SODIUM PHOSPHATES 45 MMOLE/15ML IV SOLN
30.0000 mmol | Freq: Once | INTRAVENOUS | Status: AC
  Administered 2019-12-23: 08:00:00 30 mmol via INTRAVENOUS
  Filled 2019-12-23: qty 10

## 2019-12-23 MED ORDER — ACETAMINOPHEN 325 MG PO TABS
650.0000 mg | ORAL_TABLET | Freq: Four times a day (QID) | ORAL | Status: DC
  Administered 2019-12-25 – 2019-12-28 (×8): 650 mg via ORAL
  Filled 2019-12-23 (×13): qty 2

## 2019-12-23 MED ORDER — KCL IN DEXTROSE-NACL 20-5-0.45 MEQ/L-%-% IV SOLN
INTRAVENOUS | Status: DC
  Filled 2019-12-23 (×6): qty 1000

## 2019-12-23 MED ORDER — DEXTROSE 50 % IV SOLN
1.0000 | Freq: Once | INTRAVENOUS | Status: AC
  Administered 2019-12-23: 50 mL via INTRAVENOUS
  Filled 2019-12-23: qty 50

## 2019-12-23 NOTE — Progress Notes (Signed)
Subjective:  CC: Richard [name] is a 29 y.o. male  Hospital stay day 2, 2 Days Post-Op  Diagnostic laparoscopy, exploratory laparotomy, repair of small bowel enterotomy, placement of abdominal wound VAC  HPI:  No acute issues overnight.  No flatus or BM.  Got oob to chair.  ROS:  General: Denies weight loss, weight gain, fatigue, fevers, chills, and night sweats. Heart: Denies chest pain, palpitations, racing heart, irregular heartbeat, leg pain or swelling, and decreased activity tolerance. Respiratory: Denies breathing difficulty, shortness of breath, wheezing, cough, and sputum. GI: Denies change in appetite, heartburn, nausea, vomiting, constipation, diarrhea, and blood in stool. GU: Denies difficulty urinating, pain with urinating, urgency, frequency, blood in urine.   Objective:   Temp:  [98 F (36.7 C)-98.4 F (36.9 C)] 98 F (36.7 C) (01/31 0419) Pulse Rate:  [115-122] 115 (01/31 0419) Resp:  [6-20] 18 (01/31 0419) BP: (116-141)/(64-81) 141/81 (01/31 0419) SpO2:  [97 %-98 %] 97 % (01/31 0900)     Height: 5\' 1"  (154.9 cm) Weight: 72.4 kg BMI (Calculated): 30.17   Intake/Output this shift:   Intake/Output Summary (Last 24 hours) at 12/23/2019 1345 Last data filed at 12/23/2019 0900 Gross per 24 hour  Intake 1574.79 ml  Output 1385 ml  Net 189.79 ml    Constitutional :  alert, cooperative, appears stated age and no distress  Respiratory:  clear to auscultation bilaterally  Cardiovascular:  regular rate and rhythm  Gastrointestinal: Soft, no guarding, slight distention.  Wound VAC intact with light serosanguineous output.  JP drain in right lower quadrant with some light bilious drainage..   Skin: Cool and moist.  Slight erythema noted at the inferior port site.  No obvious drainage.  Psychiatric: Normal affect, non-agitated, not confused       LABS:  CMP Latest Ref Rng & Units 12/23/2019 12/22/2019 12/21/2019  Glucose 70 - 99 mg/dL 157(H) 150(H) 177(H)  BUN 6 - 20  mg/dL 24(H) 26(H) 24(H)  Creatinine 0.61 - 1.24 mg/dL 1.31(H) 1.29(H) 1.72(H)  Sodium 135 - 145 mmol/L 141 141 139  Potassium 3.5 - 5.1 mmol/L 4.2 5.3(H) 4.1  Chloride 98 - 111 mmol/L 106 111 111  CO2 22 - 32 mmol/L 29 24 22   Calcium 8.9 - 10.3 mg/dL 7.9(L) 7.1(L) 7.6(L)  Total Protein 6.5 - 8.1 g/dL - - -  Total Bilirubin 0.3 - 1.2 mg/dL - - -  Alkaline Phos 38 - 126 U/L - - -  AST 15 - 41 U/L - - -  ALT 0 - 44 U/L - - -   CBC Latest Ref Rng & Units 12/23/2019 12/22/2019 12/21/2019  WBC 4.0 - 10.5 K/uL 5.5 4.3 2.0(L)  Hemoglobin 13.0 - 17.0 g/dL 11.7(L) 13.0 13.7  Hematocrit 39.0 - 52.0 % 35.9(L) 40.1 41.2  Platelets 150 - 400 K/uL 179 154 160    RADS: n/a Assessment:   S/p  Diagnostic laparoscopy, exploratory laparotomy, repair of small bowel enterotomy, placement of abdominal wound VAC after lap appy.  Despite 2g drop in hgb, no sign of bleeding.  Cr slightly worse.  Will repeat hgb this pm to make sure no issues.  Pt still has persistent tachycardia but patient remains calm with minimal distress.  We will need to continue to monitor.  Voiding well. Continue NG to low intermittent suction until return of bowel function. Continue pulse ox. Continue abx  Despite patient report of pain control, and relatively benign abdominal exam, RN reports patient complaint of persistent pain.  Will start tylenol around  the clock  Patient is a type I diabetic on insulin pump.  He states that he is okay with continuing with glucose sticks and not being on his typical CGM.

## 2019-12-23 NOTE — Consult Note (Signed)
Subjective: Having some mild abdominal discomfort.  With NG tube suction on.  Denies any fever.  Has not been passing gas or having any bowel movement yet.  Objective: Vital signs in last 24 hours: Temp:  [98 F (36.7 C)-98.4 F (36.9 C)] 98 F (36.7 C) (01/31 0419) Pulse Rate:  [115-122] 115 (01/31 0419) Resp:  [6-20] 18 (01/31 0419) BP: (116-141)/(64-81) 141/81 (01/31 0419) SpO2:  [97 %-98 %] 97 % (01/31 0900)  Intake/Output from previous day: 01/30 0701 - 01/31 0700 In: 1363 [I.V.:1123.9] Out: 1735 [Urine:1550; Drains:185] Intake/Output this shift: Total I/O In: 211.8 [I.V.:162.5; IV Piggyback:49.4] Out: -   Constitutional :  alert, cooperative, appears stated age; in mild distress  Respiratory:  clear to auscultation bilaterally  Cardiovascular:   Slightly tachycardic, regular rhythm  Gastrointestinal: Soft, no guarding, mild distention.  Hypoactive to no bowel sound. Wound VAC intact with light serosanguineous output.  JP drain in right lower quadrant with some light bilious drainage.. Surgical site appears dry and clean and no erythema.  Skin: Cool and moist.    Psychiatric: Normal affect, non-agitated, not confused       Results for orders placed or performed during the hospital encounter of 12/20/19 (from the past 24 hour(s))  Glucose, capillary     Status: Abnormal   Collection Time: 12/22/19  5:02 PM  Result Value Ref Range   Glucose-Capillary 42 (LL) 70 - 99 mg/dL   Comment 1 Notify RN   Lactic acid, plasma     Status: None   Collection Time: 12/22/19  5:30 PM  Result Value Ref Range   Lactic Acid, Venous 1.2 0.5 - 1.9 mmol/L  Glucose, capillary     Status: None   Collection Time: 12/22/19  5:48 PM  Result Value Ref Range   Glucose-Capillary 93 70 - 99 mg/dL  Glucose, capillary     Status: Abnormal   Collection Time: 12/22/19  7:57 PM  Result Value Ref Range   Glucose-Capillary 48 (L) 70 - 99 mg/dL  Glucose, capillary     Status: Abnormal   Collection  Time: 12/22/19  8:27 PM  Result Value Ref Range   Glucose-Capillary 115 (H) 70 - 99 mg/dL  Glucose, capillary     Status: None   Collection Time: 12/22/19  9:59 PM  Result Value Ref Range   Glucose-Capillary 91 70 - 99 mg/dL  Glucose, capillary     Status: None   Collection Time: 12/23/19 12:11 AM  Result Value Ref Range   Glucose-Capillary 85 70 - 99 mg/dL  Glucose, capillary     Status: Abnormal   Collection Time: 12/23/19  2:04 AM  Result Value Ref Range   Glucose-Capillary 151 (H) 70 - 99 mg/dL  Glucose, capillary     Status: Abnormal   Collection Time: 12/23/19  4:16 AM  Result Value Ref Range   Glucose-Capillary 167 (H) 70 - 99 mg/dL  Basic metabolic panel     Status: Abnormal   Collection Time: 12/23/19  5:09 AM  Result Value Ref Range   Sodium 141 135 - 145 mmol/L   Potassium 4.2 3.5 - 5.1 mmol/L   Chloride 106 98 - 111 mmol/L   CO2 29 22 - 32 mmol/L   Glucose, Bld 157 (H) 70 - 99 mg/dL   BUN 24 (H) 6 - 20 mg/dL   Creatinine, Ser 1.31 (H) 0.61 - 1.24 mg/dL   Calcium 7.9 (L) 8.9 - 10.3 mg/dL   GFR calc non Af Amer >60 >60  mL/min   GFR calc Af Amer >60 >60 mL/min   Anion gap 6 5 - 15  Phosphorus     Status: Abnormal   Collection Time: 12/23/19  5:09 AM  Result Value Ref Range   Phosphorus 1.6 (L) 2.5 - 4.6 mg/dL  Magnesium     Status: None   Collection Time: 12/23/19  5:09 AM  Result Value Ref Range   Magnesium 2.3 1.7 - 2.4 mg/dL  CBC with Differential/Platelet     Status: Abnormal   Collection Time: 12/23/19  5:09 AM  Result Value Ref Range   WBC 5.5 4.0 - 10.5 K/uL   RBC 3.78 (L) 4.22 - 5.81 MIL/uL   Hemoglobin 11.7 (L) 13.0 - 17.0 g/dL   HCT 14.4 (L) 31.5 - 40.0 %   MCV 95.0 80.0 - 100.0 fL   MCH 31.0 26.0 - 34.0 pg   MCHC 32.6 30.0 - 36.0 g/dL   RDW 86.7 61.9 - 50.9 %   Platelets 179 150 - 400 K/uL   nRBC 0.0 0.0 - 0.2 %   Neutrophils Relative % 76 %   Neutro Abs 4.2 1.7 - 7.7 K/uL   Lymphocytes Relative 17 %   Lymphs Abs 0.9 0.7 - 4.0 K/uL    Monocytes Relative 5 %   Monocytes Absolute 0.3 0.1 - 1.0 K/uL   Eosinophils Relative 0 %   Eosinophils Absolute 0.0 0.0 - 0.5 K/uL   Basophils Relative 2 %   Basophils Absolute 0.1 0.0 - 0.1 K/uL   WBC Morphology MORPHOLOGY UNREMARKABLE    RBC Morphology MORPHOLOGY UNREMARKABLE    Smear Review Normal platelet morphology    Immature Granulocytes 0 %   Abs Immature Granulocytes 0.02 0.00 - 0.07 K/uL  Glucose, capillary     Status: Abnormal   Collection Time: 12/23/19  8:22 AM  Result Value Ref Range   Glucose-Capillary 135 (H) 70 - 99 mg/dL  Glucose, capillary     Status: Abnormal   Collection Time: 12/23/19 12:24 PM  Result Value Ref Range   Glucose-Capillary 141 (H) 70 - 99 mg/dL    Studies/Results: CT ABDOMEN PELVIS W CONTRAST  Result Date: 12/21/2019 CLINICAL DATA:  Lower abdominal pain following appendectomy earlier today EXAM: CT ABDOMEN AND PELVIS WITH CONTRAST TECHNIQUE: Multidetector CT imaging of the abdomen and pelvis was performed using the standard protocol following bolus administration of intravenous contrast. CONTRAST:  OMNIPAQUE IOHEXOL 300 MG/ML  SOLN COMPARISON:  11/16/2019 FINDINGS: Lower chest: Linear areas of subsegmental atelectasis in the lower lobes. Hepatobiliary: Small layering gallstones within the gallbladder. No focal hepatic abnormality. Pancreas: No focal abnormality or ductal dilatation. Spleen: No focal abnormality.  Normal size. Adrenals/Urinary Tract: No adrenal abnormality. No focal renal abnormality. No stones or hydronephrosis. Urinary bladder is unremarkable. Stomach/Bowel: Postoperative changes from appendectomy. Stranding in some wall thickening noted in the region of the cecum, likely related to today's surgery. No evidence of bowel obstruction. Stomach and small bowel decompressed, unremarkable. Moderate stool in the colon. Vascular/Lymphatic: No evidence of aneurysm or adenopathy. Reproductive: No visible focal abnormality. Other: Moderate  free air and free fluid noted in the abdomen and pelvis, with fluid most pronounced in the right lower quadrant, cul-de-sac and adjacent to the liver. Musculoskeletal: No acute bony abnormality. IMPRESSION: Postoperative changes from earlier appendectomy. Moderate free air and free fluid in the abdomen and pelvis, presumably related to recent postoperative state. Cholelithiasis. Bibasilar atelectasis. Electronically Signed   By: Charlett Nose M.D.   On: 12/21/2019 01:03  DG Chest Port 1 View  Result Date: 12/22/2019 CLINICAL DATA:  Asthma.  Follow-up. EXAM: PORTABLE CHEST 1 VIEW COMPARISON:  11/13/2019.  CT abdomen done yesterday. FINDINGS: Nasogastric tube enters the abdomen. Slight worsening of linear atelectatic changes in the lungs, particularly the lung bases. No lobar collapse. No visible effusion. No pneumothorax. IMPRESSION: Slight worsening of postoperative atelectatic changes. Electronically Signed   By: Paulina Fusi M.D.   On: 12/22/2019 06:54    Scheduled Meds: . acetaminophen  650 mg Oral Q6H  . ARIPiprazole  30 mg Oral QHS  . Chlorhexidine Gluconate Cloth  6 each Topical Daily  . dextrose  1 ampule Intravenous Once  . DULoxetine  90 mg Oral QHS  . enoxaparin (LOVENOX) injection  40 mg Subcutaneous Q24H  . lamoTRIgine  200 mg Oral QHS  . pregabalin  300 mg Oral BID  . propranolol ER  60 mg Oral QHS  . sodium chloride flush  3 mL Intravenous Once  . sodium chloride flush  3 mL Intravenous Q12H  . tiaGABine  8 mg Oral QHS   Continuous Infusions: . sodium chloride    . dextrose 35 mL/hr at 12/23/19 0900  . famotidine (PEPCID) IV 20 mg (12/23/19 1354)  . piperacillin-tazobactam (ZOSYN)  IV 12.5 mL/hr at 12/23/19 0900   PRN Meds:sodium chloride, benzonatate, HYDROmorphone (DILAUDID) injection, ondansetron **OR** ondansetron (ZOFRAN) IV, ondansetron (ZOFRAN) IV, sodium chloride flush, zolpidem  Assessment/Plan:  Abdominal pain: Still having abdominal pain  - s/p appendicitis  s/p ex lap thermal injury at terminal ileum -Currently on NG tube with suction -N.p.o. -Adequate pain management -Care per primary team, general surgery  Shock: Resolved -Status post post op hypovolumic shock and sepsis -Responding well to IVF's and ABX; continue -Follow culture  ELECTROLYTE imbalance: -Status post replacement -follow labs as needed  Diabetes mellitus type 1:Hypoglycemic occasionally  -Status post hypoglycemia treatment; use as needed - checking blood glucose but no insulin  DVT/GI PRX ordered   We will continue to follow   LOS: 2 days   Richard [name]

## 2019-12-23 NOTE — Progress Notes (Signed)
RN encouraged patient to sit in chair and to reposition in bed.  Patient states he did not want to due to movement increasing pain level.  Education given and encouragment on benefits of ambulation and moving post op. 1830 Patient consented to stand at side of bed to void, tolerated well.  Linen changed and repositioned in. Pain med given with effective result per patient.  IS encouraged with using pillow to splint in case coughing. Patient able to demonstrate x1. Patient needs lots of encouragement to complete tasks. Will continue to monitor.

## 2019-12-23 NOTE — Progress Notes (Signed)
Pharmacy Electrolyte Monitoring Consult:  Pharmacy consulted to assist in monitoring and replacing electrolytes in this 29 y.o. male admitted on 12/20/2019 with Post-op Problem 1/28 s/p lap appendectomy   Labs:  Sodium (mmol/L)  Date Value  12/23/2019 141   Potassium (mmol/L)  Date Value  12/23/2019 4.2   Magnesium (mg/dL)  Date Value  43/15/4008 2.3   Phosphorus (mg/dL)  Date Value  67/61/9509 1.6 (L)   Calcium (mg/dL)  Date Value  32/67/1245 7.9 (L)   Albumin (g/dL)  Date Value  80/99/8338 4.0   SCr 1.45 >1.72>1.29>1.31 Currently NPO except for meds  Assessment/Plan: K 4.2, Mag 2.3, Phos 1.6  Scr 1.29  NP has ordered sodium phosphate 30 mmol IV x 1 Recheck labs in AM  Pharmacy will continue to follow.   Richard [name] A 12/23/2019 7:14 AM

## 2019-12-24 LAB — CBC WITH DIFFERENTIAL/PLATELET
Abs Immature Granulocytes: 0.02 10*3/uL (ref 0.00–0.07)
Basophils Absolute: 0.1 10*3/uL (ref 0.0–0.1)
Basophils Relative: 1 %
Eosinophils Absolute: 0.1 10*3/uL (ref 0.0–0.5)
Eosinophils Relative: 1 %
HCT: 34.6 % — ABNORMAL LOW (ref 39.0–52.0)
Hemoglobin: 11.4 g/dL — ABNORMAL LOW (ref 13.0–17.0)
Immature Granulocytes: 0 %
Lymphocytes Relative: 17 %
Lymphs Abs: 1.1 10*3/uL (ref 0.7–4.0)
MCH: 31.3 pg (ref 26.0–34.0)
MCHC: 32.9 g/dL (ref 30.0–36.0)
MCV: 95.1 fL (ref 80.0–100.0)
Monocytes Absolute: 0.5 10*3/uL (ref 0.1–1.0)
Monocytes Relative: 8 %
Neutro Abs: 4.9 10*3/uL (ref 1.7–7.7)
Neutrophils Relative %: 73 %
Platelets: 179 10*3/uL (ref 150–400)
RBC: 3.64 MIL/uL — ABNORMAL LOW (ref 4.22–5.81)
RDW: 14 % (ref 11.5–15.5)
WBC: 6.6 10*3/uL (ref 4.0–10.5)
nRBC: 0 % (ref 0.0–0.2)

## 2019-12-24 LAB — GLUCOSE, CAPILLARY
Glucose-Capillary: 103 mg/dL — ABNORMAL HIGH (ref 70–99)
Glucose-Capillary: 108 mg/dL — ABNORMAL HIGH (ref 70–99)
Glucose-Capillary: 115 mg/dL — ABNORMAL HIGH (ref 70–99)
Glucose-Capillary: 158 mg/dL — ABNORMAL HIGH (ref 70–99)
Glucose-Capillary: 52 mg/dL — ABNORMAL LOW (ref 70–99)
Glucose-Capillary: 52 mg/dL — ABNORMAL LOW (ref 70–99)
Glucose-Capillary: 63 mg/dL — ABNORMAL LOW (ref 70–99)
Glucose-Capillary: 84 mg/dL (ref 70–99)
Glucose-Capillary: 95 mg/dL (ref 70–99)

## 2019-12-24 LAB — BASIC METABOLIC PANEL
Anion gap: 10 (ref 5–15)
BUN: 20 mg/dL (ref 6–20)
CO2: 31 mmol/L (ref 22–32)
Calcium: 8.1 mg/dL — ABNORMAL LOW (ref 8.9–10.3)
Chloride: 102 mmol/L (ref 98–111)
Creatinine, Ser: 1.18 mg/dL (ref 0.61–1.24)
GFR calc Af Amer: >60 mL/min (ref >60)
GFR calc non Af Amer: >60 mL/min (ref >60)
Glucose, Bld: 103 mg/dL — ABNORMAL HIGH (ref 70–99)
Potassium: 3.9 mmol/L (ref 3.5–5.1)
Sodium: 143 mmol/L (ref 135–145)

## 2019-12-24 LAB — MAGNESIUM: Magnesium: 2.2 mg/dL (ref 1.7–2.4)

## 2019-12-24 LAB — PHOSPHORUS: Phosphorus: 2.2 mg/dL — ABNORMAL LOW (ref 2.5–4.6)

## 2019-12-24 LAB — SURGICAL PATHOLOGY

## 2019-12-24 MED ORDER — SODIUM PHOSPHATES 45 MMOLE/15ML IV SOLN
10.0000 mmol | Freq: Once | INTRAVENOUS | Status: AC
  Administered 2019-12-24: 13:00:00 10 mmol via INTRAVENOUS
  Filled 2019-12-24: qty 3.33

## 2019-12-24 MED ORDER — DEXTROSE 50 % IV SOLN
25.0000 mL | Freq: Once | INTRAVENOUS | Status: AC
  Administered 2019-12-24: 12:00:00 25 mL via INTRAVENOUS

## 2019-12-24 MED ORDER — DEXTROSE 10 % IV SOLN: INTRAVENOUS | Status: DC

## 2019-12-24 MED ORDER — DEXTROSE 50 % IV SOLN
25.0000 g | INTRAVENOUS | Status: AC
  Administered 2019-12-24: 20:00:00 25 g via INTRAVENOUS

## 2019-12-24 MED ORDER — DEXTROSE 50 % IV SOLN
INTRAVENOUS | Status: AC
  Filled 2019-12-24: qty 50

## 2019-12-24 NOTE — Progress Notes (Addendum)
Pharmacy Electrolyte Monitoring Consult:  Pharmacy consulted to assist in monitoring and replacing electrolytes in this 29 y.o. male admitted on 12/20/2019 with Post-op Problem 1/28 s/p lap appendectomy   Labs:  Sodium (mmol/L)  Date Value  12/24/2019 143   Potassium (mmol/L)  Date Value  12/24/2019 3.9   Magnesium (mg/dL)  Date Value  04/15/9101 2.3   Phosphorus (mg/dL)  Date Value  89/12/2838 2.2 (L)   Calcium (mg/dL)  Date Value  69/86/1483 8.1 (L)   Albumin (g/dL)  Date Value  07/35/4301 4.0   SCr 1.45 >1.72>1.29>1.31>1.18 Currently NPO except for meds D5-1/2NS with KCl 20 mEq at 75 ml/hr  Assessment/Plan: K 3.9, Mag 2.3 (on 1/31), Phos 2.2  Scr 1.18 Will order sodium phosphate 10 mmol IV x 1  Recheck labs in AM  Pharmacy will continue to follow.   Marty Heck 12/24/2019 7:30 AM

## 2019-12-24 NOTE — Progress Notes (Signed)
Patient's CBG was 52 @ 1955. Patient is NPO with NG tube. Hypoglycemic protocol was started. D50 25G 90ml given @ 2007. Reck CBG @ 2028 was 158. Manuela Schwartz, NP was on the floor. I spoke with her and she ordered D10 IV @ 70ml/hr continuous with D5 already running. Patient stated that his insulin pump was off but I asked that he disconnect it to be sure. His husband took it home with him.

## 2019-12-24 NOTE — Progress Notes (Signed)
Patient has been in the bed most of the day. Was up to chair with some assist and tolerated well. CBG's have ranged from 53-108 throughout the day and did received 51ml of D50. Patient stated he was feeling much better after receiving the D50. Pain stayed consistent throughout the day and received PRN medication. Will continue to monitor for changes and drainage from JP and wound vac.

## 2019-12-24 NOTE — Care Management Important Message (Signed)
Important Message  Patient Details  Name: Richard [name] MRN: [id number] Date of Birth: [birth year]   Medicare Important Message Given:  Yes     [name] 12/24/2019, 11:31 AM

## 2019-12-24 NOTE — Progress Notes (Signed)
Pharmacy Antibiotic Note  Richard [name] is a 29 y.o. male admitted on 12/20/2019 with intra-abdominal infection.  Pharmacy has been consulted for Zosyn dosing.  Plan: Zosyn 3.375g IV q8h (4 hour infusion).  Height: 5\' 1"  (154.9 cm) Weight: 159 lb 9.8 oz (72.4 kg) IBW/kg (Calculated) : 52.3  Temp (24hrs), Avg:98.4 F (36.9 C), Min:97.7 F (36.5 C), Max:98.9 F (37.2 C)  Recent Labs  Lab 12/20/19 2202 12/21/19 0042 12/21/19 0339 12/21/19 0541 12/21/19 0738 12/22/19 0427 12/22/19 1730 12/23/19 0509 12/24/19 0521  WBC 2.1*  --   --  2.0*  --  4.3  --  5.5 6.6  CREATININE 1.45*  --   --  1.72*  --  1.29*  --  1.31* 1.18  LATICACIDVEN  --  3.6* 4.5*  --  2.8*  --  1.2  --   --     Estimated Creatinine Clearance: 80.9 mL/min (by C-G formula based on SCr of 1.18 mg/dL).    Allergies  Allergen Reactions  . Sulfa Antibiotics Anaphylaxis    Antimicrobials this admission: Zosyn 1/29 >>  Dose adjustments this admission:   Microbiology results: 1/29 AM BCx: NGTD x2 1/29 PM BCx: NGTD x2  Thank you for allowing pharmacy to be a part of this patient's care.  2/29 12/24/2019 10:11 AM

## 2019-12-24 NOTE — Progress Notes (Addendum)
[facility] Day(s): 3.   Post op day(s): 3 Days Post-Op.   Interval History:  Patient seen and examined no acute events or new complaints overnight.  Patient reports he is doing okay Abdominal discomfort reportedly improving, rates it a 2 out of 10 No fever, chills, or emesis WBC in normal range; no fevers Hgb stable at 11, no signs of bleeding Mild hypophosphatemia at 2.2; being repleted sCr normalized; U/O - 1.5L NGT with 100 ccs out in last 24 hours Surgical drain with 85 ccs out in last 24 hours NPO; no bowel function reported   Vital signs in last 24 hours: [min-max] current  Temp:  [97.7 F (36.5 C)-98.8 F (37.1 C)] 98 F (36.7 C) (02/01 0449) Pulse Rate:  [104-108] 104 (02/01 0449) Resp:  [16-20] 18 (02/01 0449) BP: (130-147)/(74-86) 147/86 (02/01 0449) SpO2:  [97 %-98 %] 98 % (02/01 0449)     Height: 5\' 1"  (154.9 cm) Weight: 72.4 kg BMI (Calculated): 30.17   Intake/Output last 2 shifts:  01/31 0701 - 02/01 0700 In: 1366.8 [I.V.:1073.5; IV Piggyback:293.4] Out: 8127 [Urine:1500; Emesis/NG output:100; Drains:85]   Physical Exam:  Constitutional: alert, cooperative and no distress  HEENT: NGT in place Respiratory: breathing non-labored at rest  Cardiovascular: Tachycardic (104) and sinus rhythm  Gastrointestinal: soft, non-tender, and non-distended, no rebound/guarding. Blake drain in LLQ with serous output Integumentary: Wound vac to midline wound, good seal  Labs:  CBC Latest Ref Rng & Units 12/24/2019 12/23/2019 12/23/2019  WBC 4.0 - 10.5 K/uL 6.6 - 5.5  Hemoglobin 13.0 - 17.0 g/dL 11.4(L) 11.3(L) 11.7(L)  Hematocrit 39.0 - 52.0 % 34.6(L) 34.3(L) 35.9(L)  Platelets 150 - 400 K/uL 179 - 179   CMP Latest Ref Rng & Units 12/24/2019 12/23/2019 12/22/2019  Glucose 70 - 99 mg/dL 103(H) 157(H) 150(H)  BUN 6 - 20 mg/dL 20 24(H) 26(H)  Creatinine 0.61 - 1.24 mg/dL 1.18 1.31(H) 1.29(H)  Sodium 135 - 145 mmol/L 143 141  141  Potassium 3.5 - 5.1 mmol/L 3.9 4.2 5.3(H)  Chloride 98 - 111 mmol/L 102 106 111  CO2 22 - 32 mmol/L 31 29 24   Calcium 8.9 - 10.3 mg/dL 8.1(L) 7.9(L) 7.1(L)  Total Protein 6.5 - 8.1 g/dL - - -  Total Bilirubin 0.3 - 1.2 mg/dL - - -  Alkaline Phos 38 - 126 U/L - - -  AST 15 - 41 U/L - - -  ALT 0 - 44 U/L - - -     Imaging studies: No new pertinent imaging studies   Assessment/Plan:  29 y.o. male overall slowly improving awaiting return of bowel function 3 Days Post-Op s/p exploratory laparotomy and primary repair of small bowel enterotomy with wound vac placement to abdominal wall following laparoscopic appedectomy, complicated by pertinent comorbidities including T1DM.   - NPO (If no return of bowel function in a few day may need to consider TPN for prolonged ileus)  - Continue NGT decompression; monitor output; awaiting return of bowel function    - Pain control prn; antiemetics prn  - Monitor abdominal examination  - Monitor electrolytes while NPO  - Continue surgical drain; monitor and record output daily  - Wound vac change today; MWF schedule  - Encouraged mobilization   - Medical management of comorbid conditions   All of the above findings and recommendations were discussed with the patient, and the medical team, and all of patient's questions were answered to his expressed satisfaction.  -- Edison Simon,  PA-C [facility] 12/24/2019, 7:33 AM [phone] M-F: 7am - 4pm  I saw and evaluated the patient.  I agree with the above documentation, exam, and plan, which I have edited where appropriate. Duanne Guess  12:15 PM

## 2019-12-24 NOTE — Anesthesia Postprocedure Evaluation (Signed)
Anesthesia Post Note  Patient: Richard [name]  Procedure(s) Performed: APPENDECTOMY LAPAROSCOPIC (N/A Abdomen)  Anesthesia Type: General Level of consciousness: awake Anesthetic complications: no     Last Vitals:  Vitals:   12/20/19 1506 12/20/19 1513  BP: (!) 105/58 120/61  Pulse: (!) 104 (!) 109  Resp: (!) 27 16  Temp:  (!) 36.2 C  SpO2: 99% 96%    Last Pain:  Vitals:   12/20/19 1513  TempSrc: Oral  PainSc: 2            I was not notified of this patient's tachycardia while he was in PACU.        Karleen Hampshire

## 2019-12-24 NOTE — Progress Notes (Signed)
Pt stood at bedside, and tolerated fairly. I encouraged him to try and sit in the chair today. He is using incentive spirometer, but still needs encouragement and some reinforcement.

## 2019-12-24 NOTE — Progress Notes (Signed)
Subjective: Patient states feeling better than yesterday.  Has passed gas.  Mild abdominal discomfort.  Objective: Vital signs in last 24 hours: Temp:  [97.7 F (36.5 C)-98.9 F (37.2 C)] 97.8 F (36.6 C) (02/01 1321) Pulse Rate:  [101-108] 103 (02/01 1321) Resp:  [16-20] 16 (02/01 1321) BP: (126-147)/(74-87) 142/87 (02/01 1321) SpO2:  [97 %-99 %] 99 % (02/01 1321)  Intake/Output from previous day: 01/31 0701 - 02/01 0700 In: 1366.8 [I.V.:1073.5] Out: 1685 [Urine:1500; Drains:85] Intake/Output this shift: Total I/O In: -  Out: 330 [Urine:300; Drains:30]  Constitutional : alert, cooperative, appears stated age; in mild distress  Respiratory: clear to auscultation bilaterally  Cardiovascular:  Slightly tachycardic, regular rhythm  Gastrointestinal: Soft, no guarding, + distention.  Hypoactive to no bowel sound.Wound VAC intact with light serosanguineous output. JP drain in right lower quadrant with some light bilious drainage.. Surgical site appears dry and clean and no erythema.  Skin: Cool and moist.   Psychiatric: Normal affect, non-agitated, not confused    Results for orders placed or performed during the hospital encounter of 12/20/19 (from the past 24 hour(s))  Hemoglobin and hematocrit, blood     Status: Abnormal   Collection Time: 12/23/19  3:22 PM  Result Value Ref Range   Hemoglobin 11.3 (L) 13.0 - 17.0 g/dL   HCT 34.3 (L) 39.0 - 52.0 %  Glucose, capillary     Status: Abnormal   Collection Time: 12/23/19  4:35 PM  Result Value Ref Range   Glucose-Capillary 130 (H) 70 - 99 mg/dL  Glucose, capillary     Status: Abnormal   Collection Time: 12/23/19  7:41 PM  Result Value Ref Range   Glucose-Capillary 132 (H) 70 - 99 mg/dL  Glucose, capillary     Status: Abnormal   Collection Time: 12/24/19 12:24 AM  Result Value Ref Range   Glucose-Capillary 115 (H) 70 - 99 mg/dL  Glucose, capillary     Status: None   Collection Time: 12/24/19  4:49 AM  Result Value Ref  Range   Glucose-Capillary 95 70 - 99 mg/dL  CBC with Differential/Platelet     Status: Abnormal   Collection Time: 12/24/19  5:21 AM  Result Value Ref Range   WBC 6.6 4.0 - 10.5 K/uL   RBC 3.64 (L) 4.22 - 5.81 MIL/uL   Hemoglobin 11.4 (L) 13.0 - 17.0 g/dL   HCT 34.6 (L) 39.0 - 52.0 %   MCV 95.1 80.0 - 100.0 fL   MCH 31.3 26.0 - 34.0 pg   MCHC 32.9 30.0 - 36.0 g/dL   RDW 14.0 11.5 - 15.5 %   Platelets 179 150 - 400 K/uL   nRBC 0.0 0.0 - 0.2 %   Neutrophils Relative % 73 %   Neutro Abs 4.9 1.7 - 7.7 K/uL   Lymphocytes Relative 17 %   Lymphs Abs 1.1 0.7 - 4.0 K/uL   Monocytes Relative 8 %   Monocytes Absolute 0.5 0.1 - 1.0 K/uL   Eosinophils Relative 1 %   Eosinophils Absolute 0.1 0.0 - 0.5 K/uL   Basophils Relative 1 %   Basophils Absolute 0.1 0.0 - 0.1 K/uL   WBC Morphology MORPHOLOGY UNREMARKABLE    RBC Morphology MORPHOLOGY UNREMARKABLE    Smear Review MORPHOLOGY UNREMARKABLE    Immature Granulocytes 0 %   Abs Immature Granulocytes 0.02 0.00 - 0.07 K/uL  Basic metabolic panel     Status: Abnormal   Collection Time: 12/24/19  5:21 AM  Result Value Ref Range   Sodium  143 135 - 145 mmol/L   Potassium 3.9 3.5 - 5.1 mmol/L   Chloride 102 98 - 111 mmol/L   CO2 31 22 - 32 mmol/L   Glucose, Bld 103 (H) 70 - 99 mg/dL   BUN 20 6 - 20 mg/dL   Creatinine, Ser 5.17 0.61 - 1.24 mg/dL   Calcium 8.1 (L) 8.9 - 10.3 mg/dL   GFR calc non Af Amer >60 >60 mL/min   GFR calc Af Amer >60 >60 mL/min   Anion gap 10 5 - 15  Phosphorus     Status: Abnormal   Collection Time: 12/24/19  5:21 AM  Result Value Ref Range   Phosphorus 2.2 (L) 2.5 - 4.6 mg/dL  Magnesium     Status: None   Collection Time: 12/24/19  5:21 AM  Result Value Ref Range   Magnesium 2.2 1.7 - 2.4 mg/dL  Glucose, capillary     Status: Abnormal   Collection Time: 12/24/19  7:27 AM  Result Value Ref Range   Glucose-Capillary 103 (H) 70 - 99 mg/dL  Glucose, capillary     Status: Abnormal   Collection Time: 12/24/19 11:36  AM  Result Value Ref Range   Glucose-Capillary 63 (L) 70 - 99 mg/dL  Glucose, capillary     Status: Abnormal   Collection Time: 12/24/19 12:09 PM  Result Value Ref Range   Glucose-Capillary 52 (L) 70 - 99 mg/dL  Glucose, capillary     Status: Abnormal   Collection Time: 12/24/19  1:07 PM  Result Value Ref Range   Glucose-Capillary 108 (H) 70 - 99 mg/dL    Studies/Results: CT ABDOMEN PELVIS W CONTRAST  Result Date: 12/21/2019 CLINICAL DATA:  Lower abdominal pain following appendectomy earlier today EXAM: CT ABDOMEN AND PELVIS WITH CONTRAST TECHNIQUE: Multidetector CT imaging of the abdomen and pelvis was performed using the standard protocol following bolus administration of intravenous contrast. CONTRAST:  OMNIPAQUE IOHEXOL 300 MG/ML  SOLN COMPARISON:  11/16/2019 FINDINGS: Lower chest: Linear areas of subsegmental atelectasis in the lower lobes. Hepatobiliary: Small layering gallstones within the gallbladder. No focal hepatic abnormality. Pancreas: No focal abnormality or ductal dilatation. Spleen: No focal abnormality.  Normal size. Adrenals/Urinary Tract: No adrenal abnormality. No focal renal abnormality. No stones or hydronephrosis. Urinary bladder is unremarkable. Stomach/Bowel: Postoperative changes from appendectomy. Stranding in some wall thickening noted in the region of the cecum, likely related to today's surgery. No evidence of bowel obstruction. Stomach and small bowel decompressed, unremarkable. Moderate stool in the colon. Vascular/Lymphatic: No evidence of aneurysm or adenopathy. Reproductive: No visible focal abnormality. Other: Moderate free air and free fluid noted in the abdomen and pelvis, with fluid most pronounced in the right lower quadrant, cul-de-sac and adjacent to the liver. Musculoskeletal: No acute bony abnormality. IMPRESSION: Postoperative changes from earlier appendectomy. Moderate free air and free fluid in the abdomen and pelvis, presumably related to recent  postoperative state. Cholelithiasis. Bibasilar atelectasis. Electronically Signed   By: Charlett Nose M.D.   On: 12/21/2019 01:03   DG Chest Port 1 View  Result Date: 12/22/2019 CLINICAL DATA:  Asthma.  Follow-up. EXAM: PORTABLE CHEST 1 VIEW COMPARISON:  11/13/2019.  CT abdomen done yesterday. FINDINGS: Nasogastric tube enters the abdomen. Slight worsening of linear atelectatic changes in the lungs, particularly the lung bases. No lobar collapse. No visible effusion. No pneumothorax. IMPRESSION: Slight worsening of postoperative atelectatic changes. Electronically Signed   By: Paulina Fusi M.D.   On: 12/22/2019 06:54    Scheduled Meds: . acetaminophen  650 mg Oral Q6H  . ARIPiprazole  30 mg Oral QHS  . Chlorhexidine Gluconate Cloth  6 each Topical Daily  . dextrose  1 ampule Intravenous Once  . DULoxetine  90 mg Oral QHS  . enoxaparin (LOVENOX) injection  40 mg Subcutaneous Q24H  . lamoTRIgine  200 mg Oral QHS  . pregabalin  300 mg Oral BID  . propranolol ER  60 mg Oral QHS  . sodium chloride flush  3 mL Intravenous Once  . sodium chloride flush  3 mL Intravenous Q12H  . tiaGABine  8 mg Oral QHS   Continuous Infusions: . sodium chloride 250 mL (12/24/19 0810)  . dextrose 5 % and 0.45 % NaCl with KCl 20 mEq/L 75 mL/hr at 12/24/19 0405  . famotidine (PEPCID) IV 20 mg (12/24/19 1023)  . piperacillin-tazobactam (ZOSYN)  IV 12.5 mL/hr at 12/24/19 1129  . sodium phosphate  Dextrose 5% IVPB 10 mmol (12/24/19 1232)   PRN Meds:sodium chloride, benzonatate, HYDROmorphone (DILAUDID) injection, ondansetron **OR** ondansetron (ZOFRAN) IV, ondansetron (ZOFRAN) IV, sodium chloride flush, zolpidem  Assessment/Plan:  Abdominal pain: Improving slowly as patient has been passing gas for the past 24 hours and his abdominal pain has been improving as well - s/p appendicitis s/p exp lap thermal injury at terminal ileum -Currently on NG tube with suction -N.p.o. -Adequate pain management -Care per  primary team, general surgery  Shock: Resolved -Status post post op hypovolumic shock and sepsis -Responding well to IVF's and ABX; continue -Follow blood culture--so far no growth from 12/21/2019  ELECTROLYTE imbalance: Resolved -Status post replacement -follow labs as needed  Diabetes mellitus type 1:Hypoglycemic occasionally  -Status post hypoglycemia treatment; use as needed - checking blood glucose but no insulin  AKI: Improving -Patient was admitted with 1.45 creatinine -Most likely due to prerenal after major surgery and decreased p.o. intake -Improving with IV hydration -Continue to monitor -Avoid potential nephrotoxic agents  DVT/GI PRX ordered  We will continue to follow    LOS: 3 days   Richard [name] Harold Hedge

## 2019-12-25 LAB — BASIC METABOLIC PANEL
Anion gap: 14 (ref 5–15)
BUN: 19 mg/dL (ref 6–20)
CO2: 27 mmol/L (ref 22–32)
Calcium: 7.9 mg/dL — ABNORMAL LOW (ref 8.9–10.3)
Chloride: 99 mmol/L (ref 98–111)
Creatinine, Ser: 1.18 mg/dL (ref 0.61–1.24)
GFR calc Af Amer: >60 mL/min (ref >60)
GFR calc non Af Amer: >60 mL/min (ref >60)
Glucose, Bld: 300 mg/dL — ABNORMAL HIGH (ref 70–99)
Potassium: 3.8 mmol/L (ref 3.5–5.1)
Sodium: 140 mmol/L (ref 135–145)

## 2019-12-25 LAB — GLUCOSE, CAPILLARY
Glucose-Capillary: 143 mg/dL — ABNORMAL HIGH (ref 70–99)
Glucose-Capillary: 180 mg/dL — ABNORMAL HIGH (ref 70–99)
Glucose-Capillary: 253 mg/dL — ABNORMAL HIGH (ref 70–99)
Glucose-Capillary: 275 mg/dL — ABNORMAL HIGH (ref 70–99)
Glucose-Capillary: 293 mg/dL — ABNORMAL HIGH (ref 70–99)
Glucose-Capillary: 317 mg/dL — ABNORMAL HIGH (ref 70–99)

## 2019-12-25 LAB — CBC WITH DIFFERENTIAL/PLATELET
Abs Immature Granulocytes: 0.05 10*3/uL (ref 0.00–0.07)
Basophils Absolute: 0 10*3/uL (ref 0.0–0.1)
Basophils Relative: 1 %
Eosinophils Absolute: 0 10*3/uL (ref 0.0–0.5)
Eosinophils Relative: 0 %
HCT: 34.4 % — ABNORMAL LOW (ref 39.0–52.0)
Hemoglobin: 11.1 g/dL — ABNORMAL LOW (ref 13.0–17.0)
Immature Granulocytes: 1 %
Lymphocytes Relative: 16 %
Lymphs Abs: 0.9 10*3/uL (ref 0.7–4.0)
MCH: 30.7 pg (ref 26.0–34.0)
MCHC: 32.3 g/dL (ref 30.0–36.0)
MCV: 95.3 fL (ref 80.0–100.0)
Monocytes Absolute: 0.9 10*3/uL (ref 0.1–1.0)
Monocytes Relative: 15 %
Neutro Abs: 3.9 10*3/uL (ref 1.7–7.7)
Neutrophils Relative %: 67 %
Platelets: 184 10*3/uL (ref 150–400)
RBC: 3.61 MIL/uL — ABNORMAL LOW (ref 4.22–5.81)
RDW: 13.9 % (ref 11.5–15.5)
WBC: 5.8 10*3/uL (ref 4.0–10.5)
nRBC: 0 % (ref 0.0–0.2)

## 2019-12-25 LAB — MAGNESIUM: Magnesium: 1.8 mg/dL (ref 1.7–2.4)

## 2019-12-25 LAB — PHOSPHORUS: Phosphorus: 3.2 mg/dL (ref 2.5–4.6)

## 2019-12-25 MED ORDER — FAMOTIDINE 20 MG PO TABS
20.0000 mg | ORAL_TABLET | Freq: Two times a day (BID) | ORAL | Status: DC
  Administered 2019-12-25 – 2019-12-28 (×7): 20 mg via ORAL
  Filled 2019-12-25 (×7): qty 1

## 2019-12-25 MED ORDER — INSULIN GLARGINE 100 UNIT/ML ~~LOC~~ SOLN
20.0000 [IU] | Freq: Every day | SUBCUTANEOUS | Status: DC
  Administered 2019-12-25 – 2019-12-26 (×2): 20 [IU] via SUBCUTANEOUS
  Filled 2019-12-25 (×2): qty 0.2

## 2019-12-25 MED ORDER — MAGNESIUM SULFATE 2 GM/50ML IV SOLN
2.0000 g | Freq: Once | INTRAVENOUS | Status: AC
  Administered 2019-12-25: 2 g via INTRAVENOUS
  Filled 2019-12-25: qty 50

## 2019-12-25 MED ORDER — INSULIN ASPART 100 UNIT/ML ~~LOC~~ SOLN: 0.0000 [IU] | Freq: Three times a day (TID) | SUBCUTANEOUS | Status: DC

## 2019-12-25 MED ORDER — INSULIN ASPART 100 UNIT/ML ~~LOC~~ SOLN
0.0000 [IU] | Freq: Three times a day (TID) | SUBCUTANEOUS | Status: DC
  Administered 2019-12-25: 08:00:00 15 [IU] via SUBCUTANEOUS
  Administered 2019-12-25 – 2019-12-26 (×4): 11 [IU] via SUBCUTANEOUS
  Administered 2019-12-26: 18:00:00 7 [IU] via SUBCUTANEOUS
  Administered 2019-12-27: 3 [IU] via SUBCUTANEOUS
  Administered 2019-12-27: 08:00:00 4 [IU] via SUBCUTANEOUS
  Filled 2019-12-25 (×8): qty 1

## 2019-12-25 NOTE — Progress Notes (Signed)
Pharmacy Electrolyte Monitoring Consult:  Pharmacy consulted to assist in monitoring and replacing electrolytes in this 29 y.o. male admitted on 12/20/2019 with Post-op Problem 1/28 s/p lap appendectomy   Labs:  Sodium (mmol/L)  Date Value  12/25/2019 140   Potassium (mmol/L)  Date Value  12/25/2019 3.8   Magnesium (mg/dL)  Date Value  86/57/8469 1.8   Phosphorus (mg/dL)  Date Value  62/95/2841 3.2   Calcium (mg/dL)  Date Value  32/44/0102 7.9 (L)   Albumin (g/dL)  Date Value  72/53/6644 4.0   SCr 1.45 >1.72>1.29>1.31>1.18 Currently NPO except for meds D5-1/2NS with KCl 20 mEq at 75 ml/hr  Assessment/Plan: K 3.9, Mag 2.3 (on 1/31), Phos 3.2  Scr 1.18 Current levels WNL, no additional supplementation needed. Recheck labs in AM  Pharmacy will continue to follow.   Sarthak Rubenstein A Tahni Porchia 12/25/2019 7:24 AM

## 2019-12-25 NOTE — Evaluation (Signed)
Physical Therapy Evaluation Patient Details Name: Richard [name] MRN: [id number] DOB: [birth year] Today's Date: 12/25/2019   History of Present Illness  Per MD notes: Pt is a 29 y.o. male s/p exploratory laparotomy and primary repair of small bowel enterotomy with wound vac placement to abdominal wall following laparoscopic appedectomy, complicated by pertinent comorbidities including T1DM.  Pt also s/p hypovolumic shock and sepsis as well as electrolyte imbalance which have resolved.  Pt with AKI most likely due to prerenal after major surgery and decreased p.o. intake.    Clinical Impression  Pt with flat affect during the session and guarded/limited by abdominal pain during the session.  Pt participated with gentle therex, log roll training, and transfers but declined amb attempt secondary to increased abdominal pain in standing.  Pt demonstrated fair to good functional strength during the session but reported feeling minimally unsteady in standing and requested a RW for support and increased confidence with standing activities.  Pt will benefit from HHPT upon discharge to safely address deficits listed in patient problem list for decreased caregiver assistance and eventual return to PLOF.     Follow Up Recommendations Home health PT;Supervision - Intermittent    Equipment Recommendations  Rolling walker with 5" wheels    Recommendations for Other Services       Precautions / Restrictions Precautions Precautions: None Restrictions Other Position/Activity Restrictions: HOB to >30 deg; abdominal wound with wound vac as well as JP drain in place      Mobility  Bed Mobility Overal bed mobility: Needs Assistance Bed Mobility: Rolling;Sidelying to Sit;Sit to Sidelying Rolling: Supervision Sidelying to sit: Min assist     Sit to sidelying: Supervision General bed mobility comments: Mod verbal cues for sequencing  Transfers Overall transfer level: Needs assistance Equipment used:  None Transfers: Sit to/from Stand Sit to Stand: Supervision         General transfer comment: Fair eccentric and concentric control with good stability  Ambulation/Gait             General Gait Details: Pt declined amb secondary to abdominal pain in standing  Stairs            Wheelchair Mobility    Modified Rankin (Stroke Patients Only)       Balance Overall balance assessment: Needs assistance   Sitting balance-Leahy Scale: Normal     Standing balance support: Bilateral upper extremity supported Standing balance-Leahy Scale: Good                               Pertinent Vitals/Pain Pain Assessment: 0-10 Pain Score: 8  Pain Location: Abdominal pain Pain Descriptors / Indicators: Operative site guarding;Aching Pain Intervention(s): Monitored during session;RN gave pain meds during session;Limited activity within patient's tolerance    Home Living Family/patient expects to be discharged to:: Private residence Living Arrangements: Spouse/significant other Available Help at Discharge: Family;Available 24 hours/day Type of Home: Apartment Home Access: Stairs to enter Entrance Stairs-Rails: None Entrance Stairs-Number of Steps: 1 curb from parking area Home Layout: One level Home Equipment: Cane - quad;Crutches      Prior Function Level of Independence: Independent         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, no fall history, Ind with ADLs     Hand Dominance        Extremity/Trunk Assessment   Upper Extremity Assessment Upper Extremity Assessment: Generalized weakness    Lower Extremity Assessment Lower Extremity Assessment: Generalized weakness  Communication   Communication: No difficulties  Cognition Arousal/Alertness: Awake/alert Behavior During Therapy: WFL for tasks assessed/performed Overall Cognitive Status: Within Functional Limits for tasks assessed                                         General Comments      Exercises Total Joint Exercises Ankle Circles/Pumps: AROM;Strengthening;Both;10 reps Quad Sets: Strengthening;Both;10 reps Gluteal Sets: Strengthening;Both;10 reps Other Exercises Other Exercises: HEP for BLE APs, QS, and GS x 10 each every 1-2 hours daily Other Exercises: Log roll training   Assessment/Plan    PT Assessment Patient needs continued PT services  PT Problem List Decreased strength;Decreased activity tolerance;Decreased balance;Decreased mobility;Decreased knowledge of use of DME;Pain       PT Treatment Interventions DME instruction;Gait training;Stair training;Functional mobility training;Therapeutic activities;Therapeutic exercise;Balance training;Patient/family education    PT Goals (Current goals can be found in the Care Plan section)  Acute Rehab PT Goals Patient Stated Goal: To get stronger PT Goal Formulation: With patient Time For Goal Achievement: 01/07/20 Potential to Achieve Goals: Good    Frequency Min 2X/week   Barriers to discharge        Co-evaluation               AM-PAC PT "6 Clicks" Mobility  Outcome Measure Help needed turning from your back to your side while in a flat bed without using bedrails?: A Little Help needed moving from lying on your back to sitting on the side of a flat bed without using bedrails?: A Little Help needed moving to and from a bed to a chair (including a wheelchair)?: A Little Help needed standing up from a chair using your arms (e.g., wheelchair or bedside chair)?: A Little Help needed to walk in hospital room?: A Little Help needed climbing 3-5 steps with a railing? : A Little 6 Click Score: 18    End of Session Equipment Utilized During Treatment: Oxygen Activity Tolerance: Patient limited by pain Patient left: in bed;with call bell/phone within reach Nurse Communication: Mobility status;Other (comment)(Pt left on 3LO2/min at end of session with SpO2 96-97%) PT Visit  Diagnosis: Muscle weakness (generalized) (M62.81);Difficulty in walking, not elsewhere classified (R26.2);Pain Pain - Right/Left: (abdominal pain)    Time: 1010-1055 PT Time Calculation (min) (ACUTE ONLY): 45 min   Charges:   PT Evaluation $PT Eval Moderate Complexity: 1 Mod PT Treatments $Therapeutic Activity: 8-22 mins        D. Scott Cameran Ahmed PT, DPT 12/25/19, 11:19 AM

## 2019-12-25 NOTE — Progress Notes (Signed)
Subjective: S/P NGT removal this AM. Patient states feeling better than yesterday.  Has passed gas.  Mild abdominal discomfort.  Started clear liquid diet by surgery  Objective: Vital signs in last 24 hours: Temp:  [97.8 F (36.6 C)-99.1 F (37.3 C)] 98.2 F (36.8 C) (02/02 0337) Pulse Rate:  [103-109] 109 (02/02 0337) Resp:  [16-20] 20 (02/02 0337) BP: (131-142)/(75-87) 131/75 (02/02 0337) SpO2:  [99 %] 99 % (02/02 0337)  Intake/Output from previous day: 02/01 0701 - 02/02 0700 In: 1760.3 [I.V.:1483.6] Out: 2395 [Urine:1500; Drains:45] Intake/Output this shift: No intake/output data recorded.  Constitutional :  Sitting on chair not appear to be in acute distress  Respiratory: clear to auscultation bilaterally  Cardiovascular:  Slightly tachycardic, regular rhythm  Gastrointestinal: Soft, no guarding, + distention.  Hypoactive to no bowel sound.Wound VAC intact with light serosanguineous output. JP drain in right lower quadrant with some light bilious drainage.. Surgical site appears dry and clean and no erythema.  Skin: Cool and moist.   Psychiatric: Normal affect, non-agitated, not confused    Results for orders placed or performed during the hospital encounter of 12/20/19 (from the past 24 hour(s))  Glucose, capillary     Status: Abnormal   Collection Time: 12/24/19 12:09 PM  Result Value Ref Range   Glucose-Capillary 52 (L) 70 - 99 mg/dL  Glucose, capillary     Status: Abnormal   Collection Time: 12/24/19  1:07 PM  Result Value Ref Range   Glucose-Capillary 108 (H) 70 - 99 mg/dL  Glucose, capillary     Status: None   Collection Time: 12/24/19  3:32 PM  Result Value Ref Range   Glucose-Capillary 84 70 - 99 mg/dL  Glucose, capillary     Status: Abnormal   Collection Time: 12/24/19  7:55 PM  Result Value Ref Range   Glucose-Capillary 52 (L) 70 - 99 mg/dL  Glucose, capillary     Status: Abnormal   Collection Time: 12/24/19  8:28 PM  Result Value Ref Range   Glucose-Capillary 158 (H) 70 - 99 mg/dL  Glucose, capillary     Status: Abnormal   Collection Time: 12/25/19 12:05 AM  Result Value Ref Range   Glucose-Capillary 180 (H) 70 - 99 mg/dL  Glucose, capillary     Status: Abnormal   Collection Time: 12/25/19  3:34 AM  Result Value Ref Range   Glucose-Capillary 275 (H) 70 - 99 mg/dL  CBC with Differential/Platelet     Status: Abnormal   Collection Time: 12/25/19  5:21 AM  Result Value Ref Range   WBC 5.8 4.0 - 10.5 K/uL   RBC 3.61 (L) 4.22 - 5.81 MIL/uL   Hemoglobin 11.1 (L) 13.0 - 17.0 g/dL   HCT 34.4 (L) 39.0 - 52.0 %   MCV 95.3 80.0 - 100.0 fL   MCH 30.7 26.0 - 34.0 pg   MCHC 32.3 30.0 - 36.0 g/dL   RDW 13.9 11.5 - 15.5 %   Platelets 184 150 - 400 K/uL   nRBC 0.0 0.0 - 0.2 %   Neutrophils Relative % 67 %   Neutro Abs 3.9 1.7 - 7.7 K/uL   Lymphocytes Relative 16 %   Lymphs Abs 0.9 0.7 - 4.0 K/uL   Monocytes Relative 15 %   Monocytes Absolute 0.9 0.1 - 1.0 K/uL   Eosinophils Relative 0 %   Eosinophils Absolute 0.0 0.0 - 0.5 K/uL   Basophils Relative 1 %   Basophils Absolute 0.0 0.0 - 0.1 K/uL   Immature Granulocytes 1 %  Abs Immature Granulocytes 0.05 0.00 - 0.07 K/uL  Basic metabolic panel     Status: Abnormal   Collection Time: 12/25/19  5:21 AM  Result Value Ref Range   Sodium 140 135 - 145 mmol/L   Potassium 3.8 3.5 - 5.1 mmol/L   Chloride 99 98 - 111 mmol/L   CO2 27 22 - 32 mmol/L   Glucose, Bld 300 (H) 70 - 99 mg/dL   BUN 19 6 - 20 mg/dL   Creatinine, Ser 3.29 0.61 - 1.24 mg/dL   Calcium 7.9 (L) 8.9 - 10.3 mg/dL   GFR calc non Af Amer >60 >60 mL/min   GFR calc Af Amer >60 >60 mL/min   Anion gap 14 5 - 15  Magnesium     Status: None   Collection Time: 12/25/19  5:21 AM  Result Value Ref Range   Magnesium 1.8 1.7 - 2.4 mg/dL  Phosphorus     Status: None   Collection Time: 12/25/19  5:21 AM  Result Value Ref Range   Phosphorus 3.2 2.5 - 4.6 mg/dL  Glucose, capillary     Status: Abnormal   Collection Time:  12/25/19  7:35 AM  Result Value Ref Range   Glucose-Capillary 317 (H) 70 - 99 mg/dL   Comment 1 Notify RN     Studies/Results: CT ABDOMEN PELVIS W CONTRAST  Result Date: 12/21/2019 CLINICAL DATA:  Lower abdominal pain following appendectomy earlier today EXAM: CT ABDOMEN AND PELVIS WITH CONTRAST TECHNIQUE: Multidetector CT imaging of the abdomen and pelvis was performed using the standard protocol following bolus administration of intravenous contrast. CONTRAST:  OMNIPAQUE IOHEXOL 300 MG/ML  SOLN COMPARISON:  11/16/2019 FINDINGS: Lower chest: Linear areas of subsegmental atelectasis in the lower lobes. Hepatobiliary: Small layering gallstones within the gallbladder. No focal hepatic abnormality. Pancreas: No focal abnormality or ductal dilatation. Spleen: No focal abnormality.  Normal size. Adrenals/Urinary Tract: No adrenal abnormality. No focal renal abnormality. No stones or hydronephrosis. Urinary bladder is unremarkable. Stomach/Bowel: Postoperative changes from appendectomy. Stranding in some wall thickening noted in the region of the cecum, likely related to today's surgery. No evidence of bowel obstruction. Stomach and small bowel decompressed, unremarkable. Moderate stool in the colon. Vascular/Lymphatic: No evidence of aneurysm or adenopathy. Reproductive: No visible focal abnormality. Other: Moderate free air and free fluid noted in the abdomen and pelvis, with fluid most pronounced in the right lower quadrant, cul-de-sac and adjacent to the liver. Musculoskeletal: No acute bony abnormality. IMPRESSION: Postoperative changes from earlier appendectomy. Moderate free air and free fluid in the abdomen and pelvis, presumably related to recent postoperative state. Cholelithiasis. Bibasilar atelectasis. Electronically Signed   By: Charlett Nose M.D.   On: 12/21/2019 01:03   DG Chest Port 1 View  Result Date: 12/22/2019 CLINICAL DATA:  Asthma.  Follow-up. EXAM: PORTABLE CHEST 1 VIEW COMPARISON:   11/13/2019.  CT abdomen done yesterday. FINDINGS: Nasogastric tube enters the abdomen. Slight worsening of linear atelectatic changes in the lungs, particularly the lung bases. No lobar collapse. No visible effusion. No pneumothorax. IMPRESSION: Slight worsening of postoperative atelectatic changes. Electronically Signed   By: Paulina Fusi M.D.   On: 12/22/2019 06:54    Scheduled Meds: . acetaminophen  650 mg Oral Q6H  . ARIPiprazole  30 mg Oral QHS  . DULoxetine  90 mg Oral QHS  . enoxaparin (LOVENOX) injection  40 mg Subcutaneous Q24H  . famotidine  20 mg Oral BID  . insulin aspart  0-20 Units Subcutaneous TID WC  .  insulin glargine  20 Units Subcutaneous Daily  . lamoTRIgine  200 mg Oral QHS  . pregabalin  300 mg Oral BID  . propranolol ER  60 mg Oral QHS  . sodium chloride flush  3 mL Intravenous Once  . sodium chloride flush  3 mL Intravenous Q12H  . tiaGABine  8 mg Oral QHS   Continuous Infusions: . sodium chloride 10 mL/hr at 12/25/19 0509  . dextrose 5 % and 0.45 % NaCl with KCl 20 mEq/L 75 mL/hr at 12/25/19 0509  . piperacillin-tazobactam (ZOSYN)  IV 3.375 g (12/25/19 0956)   PRN Meds:sodium chloride, benzonatate, HYDROmorphone (DILAUDID) injection, ondansetron **OR** ondansetron (ZOFRAN) IV, ondansetron (ZOFRAN) IV, sodium chloride flush, zolpidem  Assessment/Plan:  Abdominal pain: Improving slowly as patient has been passing gas for the past 24 hours and his abdominal pain has been improving as well - s/p appendicitis s/p exp lap thermal injury at terminal ileum -Currently on NG tube with suction -CLD started on 12/24/2019 -Adequate pain management -Care per primary team, general surgery  Shock: Resolved -Status post post op hypovolumic shock and sepsis -Responding well to IVF's and ABX; continue -Follow blood culture--so far no growth from 12/21/2019  ELECTROLYTE imbalance: Resolved -Replacement of mag as well as potassium -Status post replacement -follow labs as  needed  Diabetes mellitus type 1:BG's started going up - Started Lantus 20U and SSI-sensitive with Q4hrs BG's  Hypoglycemic occasionally when NPO   AKI: Much improved  -Patient was admitted with 1.45 creatinine -Most likely due to prerenal after major surgery and decreased p.o. intake -Improving with IV hydration -Continue to monitor -Avoid potential nephrotoxic agents  DVT/GI PRX ordered  We will continue to follow    LOS: 4 days   Belkys Henault Harold Hedge

## 2019-12-25 NOTE — TOC Initial Note (Addendum)
Transition of Care [facility]) - Initial/Assessment Note    Patient Details  Name: Richard [name] MRN: [id number] Date of Birth: October 06, 1991  [facility]) CM/SW Contact:    Candie Chroman, LCSW Phone Number: 12/25/2019, 9:51 AM  Clinical Narrative: CSW met with patient. No supports at bedside. CSW introduced role and explained that discharge planning would be discussed. Patient is agreeable to a home health nurse to manage his wound vac at home. Provided CMS scores for agencies that serve his zip code. Patient has no preference. [facility] is reviewing the referral. [place] representative patient's information to order home wound vac and inform him of potential home oxygen. Patient currently on 4 L nasal canula but says he is not on any oxygen at home. No further concerns. CSW encouraged patient to contact CSW as needed. CSW will continue to follow patient for support and facilitate return home when stable.                12:19 pm: Patient has been accepted by [facility] for nursing and PT. Patient is aware and agreeable. Also notified [facility] that, per PT, patient is agreeable to a rolling walker.  Expected Discharge Plan: [place] Barriers to Discharge: Continued Medical Work up   Patient Goals and CMS Choice   CMS Medicare.gov Compare Post Acute Care list provided to:: Patient    Expected Discharge Plan and Services Expected Discharge Plan: [place] Choice: Durable Medical Equipment, Home Health Living arrangements for the past 2 months: Apartment                                      Prior Living Arrangements/Services Living arrangements for the past 2 months: Apartment Lives with:: Spouse Patient language and need for interpreter reviewed:: Yes Do you feel safe going back to the place where you live?: Yes      Need for Family Participation in Patient Care: Yes (Comment) Care  giver support system in place?: Yes (comment)   Criminal Activity/Legal Involvement Pertinent to Current Situation/Hospitalization: No - Comment as needed  Activities of Daily Living Home Assistive Devices/Equipment: Insulin Pump ADL Screening (condition at time of admission) Patient's cognitive ability adequate to safely complete daily activities?: Yes Is the patient deaf or have difficulty hearing?: No Does the patient have difficulty seeing, even when wearing glasses/contacts?: No Does the patient have difficulty concentrating, remembering, or making decisions?: No Patient able to express need for assistance with ADLs?: Yes Does the patient have difficulty dressing or bathing?: No Independently performs ADLs?: Yes (appropriate for developmental age) Does the patient have difficulty walking or climbing stairs?: No Weakness of Legs: Both Weakness of Arms/Hands: Both  Permission Sought/Granted Permission sought to share information with : Facility Art therapist granted to share information with : Yes, Verbal Permission Granted     Permission granted to share info w AGENCY: Home Health Agencies        Emotional Assessment Appearance:: Appears stated age Attitude/Demeanor/Rapport: Engaged Affect (typically observed): Accepting, Appropriate, Calm Orientation: : Oriented to Self, Oriented to Place, Oriented to  Time, Oriented to Situation Alcohol / Substance Use: Not Applicable Psych Involvement: No (comment)  Admission diagnosis:  Abdominal distention [R14.0] Nausea [R11.0] Post-operative pain [G89.18] Acute abdomen [R10.0] Septic shock ([place]) [A41.9, R65.21] Patient Active Problem List  Diagnosis Date Noted  . Post-operative pain 12/21/2019  . Acute abdomen 12/21/2019  . Septic shock ([place]) 12/21/2019  . Intraoperative perforation of small bowel   . Nausea   . Abdominal distention   . Acute perforated appendicitis 11/15/2019  . Appendicitis with  abscess 11/13/2019   PCP:  System, Pcp Not In Pharmacy:   [facility] [address], [place] - [place] [address] Phone: [phone] Fax: [phone]     Social Determinants of Health (SDOH) Interventions    Readmission Risk Interventions No flowsheet data found.

## 2019-12-25 NOTE — Progress Notes (Signed)
Weaned patient off of 4L oxygen today. Patients O2 sats stayed above 97%.

## 2019-12-25 NOTE — Progress Notes (Addendum)
[name] SURGICAL PROGRESS NOTE  Hospital Day(s): 4.   Post op day(s): 4 Days Post-Op.   Interval History:  Patient seen and examined no acute events or new complaints overnight.  Patient reports he is feeling better No fever, chills, nausea, or emesis Labs are stable this morning and electrolyte derangements have been corrected NGT with 850 ccs out in last 24 hours (mostly water from flushes and patient oral intake) Surgical drain with 35 ccs out in last 24 hours Tolerated wound vac change yesterday He has started to pass flatus frequently.    Vital signs in last 24 hours: [min-max] current  Temp:  [97.8 F (36.6 C)-99.1 F (37.3 C)] 98.2 F (36.8 C) (02/02 0337) Pulse Rate:  [101-109] 109 (02/02 0337) Resp:  [16-20] 20 (02/02 0337) BP: (126-142)/(75-87) 131/75 (02/02 0337) SpO2:  [98 %-99 %] 99 % (02/02 0337)     Height: 5\' 1"  (154.9 cm) Weight: 72.4 kg BMI (Calculated): 30.17   Intake/Output last 2 shifts:  02/01 0701 - 02/02 0700 In: 1760.3 [I.V.:1483.6; IV Piggyback:266.7] Out: 1895 [Urine:1000; Emesis/NG output:850; Drains:45]   Physical Exam:  Constitutional: alert, cooperative and no distress  HEENT: NGT in place (removed at bedside) Respiratory: breathing non-labored at rest  Cardiovascular: Tachycardic (109) and sinus rhythm  Gastrointestinal: soft, non-tender, and non-distended, no rebound/guarding. Blake drain in LLQ with serous output Integumentary: Wound vac to midline wound, good seal   Labs:  CBC Latest Ref Rng & Units 12/25/2019 12/24/2019 12/23/2019  WBC 4.0 - 10.5 K/uL 5.8 6.6 -  Hemoglobin 13.0 - 17.0 g/dL 11.1(L) 11.4(L) 11.3(L)  Hematocrit 39.0 - 52.0 % 34.4(L) 34.6(L) 34.3(L)  Platelets 150 - 400 K/uL 184 179 -   CMP Latest Ref Rng & Units 12/25/2019 12/24/2019 12/23/2019  Glucose 70 - 99 mg/dL 12/25/2019) 798(X) 211(H)  BUN 6 - 20 mg/dL 19 20 417(E)  Creatinine 0.61 - 1.24 mg/dL 08(X 4.48 1.85)  Sodium 135 - 145 mmol/L 140 143 141   Potassium 3.5 - 5.1 mmol/L 3.8 3.9 4.2  Chloride 98 - 111 mmol/L 99 102 106  CO2 22 - 32 mmol/L 27 31 29   Calcium 8.9 - 10.3 mg/dL 7.9(L) 8.1(L) 7.9(L)  Total Protein 6.5 - 8.1 g/dL - - -  Total Bilirubin 0.3 - 1.2 mg/dL - - -  Alkaline Phos 38 - 126 U/L - - -  AST 15 - 41 U/L - - -  ALT 0 - 44 U/L - - -     Imaging studies: No new pertinent imaging studies   Assessment/Plan:  29 y.o. male slowly improving, now with return of bowel function 4 Days Post-Op s/p exploratory laparotomy and primary repair of small bowel enterotomy with wound vac placement to abdominal wall following laparoscopic appedectomy, complicated by pertinent comorbidities including T1DM.    - NGT removed this morning  - Initiate CLD  - Pain control prn; antiemetics prn             - Monitor abdominal examination             - Monitor electrolytes; stabilized             - Continue surgical drain; monitor and record output daily             - Wound vac change today; MWF schedule; will start HH orders             - Encouraged mobilization --> PT engaged             -  Medical management of comorbid conditions    All of the above findings and recommendations were discussed with the patient, and the medical team, and all of patient's questions were answered to his expressed satisfaction.  -- Edison Simon, PA-C [facility] 12/25/2019, 7:25 AM [phone] M-F: 7am - 4pm  I saw and evaluated the patient.  I agree with the above documentation, exam, and plan, which I have edited where appropriate. Fredirick Maudlin  8:05 AM

## 2019-12-25 NOTE — Progress Notes (Signed)
Patients CBG is 317. He has no insulin orders. Notified Dr. Harold Hedge of this and patient has d10 and d51/2NS 20k orders. Dr. Harold Hedge orders to stop d10, continue CBG q4h and put insulin orders in.

## 2019-12-26 LAB — CBC WITH DIFFERENTIAL/PLATELET
Abs Immature Granulocytes: 0.09 10*3/uL — ABNORMAL HIGH (ref 0.00–0.07)
Basophils Absolute: 0 10*3/uL (ref 0.0–0.1)
Basophils Relative: 1 %
Eosinophils Absolute: 0.1 10*3/uL (ref 0.0–0.5)
Eosinophils Relative: 1 %
HCT: 38 % — ABNORMAL LOW (ref 39.0–52.0)
Hemoglobin: 12.4 g/dL — ABNORMAL LOW (ref 13.0–17.0)
Immature Granulocytes: 1 %
Lymphocytes Relative: 27 %
Lymphs Abs: 1.7 10*3/uL (ref 0.7–4.0)
MCH: 30.6 pg (ref 26.0–34.0)
MCHC: 32.6 g/dL (ref 30.0–36.0)
MCV: 93.8 fL (ref 80.0–100.0)
Monocytes Absolute: 1 10*3/uL (ref 0.1–1.0)
Monocytes Relative: 16 %
Neutro Abs: 3.6 10*3/uL (ref 1.7–7.7)
Neutrophils Relative %: 54 %
Platelets: 214 10*3/uL (ref 150–400)
RBC: 4.05 MIL/uL — ABNORMAL LOW (ref 4.22–5.81)
RDW: 13.5 % (ref 11.5–15.5)
WBC: 6.6 10*3/uL (ref 4.0–10.5)
nRBC: 0 % (ref 0.0–0.2)

## 2019-12-26 LAB — CULTURE, BLOOD (ROUTINE X 2)
Culture: NO GROWTH
Culture: NO GROWTH
Culture: NO GROWTH
Culture: NO GROWTH
Special Requests: ADEQUATE
Special Requests: ADEQUATE

## 2019-12-26 LAB — BASIC METABOLIC PANEL
Anion gap: 10 (ref 5–15)
BUN: 15 mg/dL (ref 6–20)
CO2: 32 mmol/L (ref 22–32)
Calcium: 7.9 mg/dL — ABNORMAL LOW (ref 8.9–10.3)
Chloride: 97 mmol/L — ABNORMAL LOW (ref 98–111)
Creatinine, Ser: 1.04 mg/dL (ref 0.61–1.24)
GFR calc Af Amer: >60 mL/min (ref >60)
GFR calc non Af Amer: >60 mL/min (ref >60)
Glucose, Bld: 260 mg/dL — ABNORMAL HIGH (ref 70–99)
Potassium: 3.5 mmol/L (ref 3.5–5.1)
Sodium: 139 mmol/L (ref 135–145)

## 2019-12-26 LAB — GLUCOSE, CAPILLARY
Glucose-Capillary: 153 mg/dL — ABNORMAL HIGH (ref 70–99)
Glucose-Capillary: 206 mg/dL — ABNORMAL HIGH (ref 70–99)
Glucose-Capillary: 209 mg/dL — ABNORMAL HIGH (ref 70–99)
Glucose-Capillary: 251 mg/dL — ABNORMAL HIGH (ref 70–99)
Glucose-Capillary: 297 mg/dL — ABNORMAL HIGH (ref 70–99)
Glucose-Capillary: 361 mg/dL — ABNORMAL HIGH (ref 70–99)

## 2019-12-26 LAB — PHOSPHORUS: Phosphorus: 3.4 mg/dL (ref 2.5–4.6)

## 2019-12-26 LAB — MAGNESIUM: Magnesium: 1.9 mg/dL (ref 1.7–2.4)

## 2019-12-26 MED ORDER — OXYCODONE HCL 5 MG PO TABS
5.0000 mg | ORAL_TABLET | ORAL | Status: DC | PRN
  Administered 2019-12-26 – 2019-12-28 (×8): 10 mg via ORAL
  Filled 2019-12-26 (×8): qty 2

## 2019-12-26 MED ORDER — INSULIN ASPART 100 UNIT/ML ~~LOC~~ SOLN
4.0000 [IU] | Freq: Three times a day (TID) | SUBCUTANEOUS | Status: DC
  Administered 2019-12-26 – 2019-12-28 (×4): 4 [IU] via SUBCUTANEOUS
  Filled 2019-12-26 (×5): qty 1

## 2019-12-26 MED ORDER — HYDROMORPHONE HCL 1 MG/ML IJ SOLN
1.0000 mg | INTRAMUSCULAR | Status: DC | PRN
  Administered 2019-12-26 – 2019-12-28 (×3): 1 mg via INTRAVENOUS
  Filled 2019-12-26 (×3): qty 1

## 2019-12-26 MED ORDER — INSULIN ASPART 100 UNIT/ML ~~LOC~~ SOLN
0.0000 [IU] | Freq: Every day | SUBCUTANEOUS | Status: DC
  Administered 2019-12-26: 21:00:00 5 [IU] via SUBCUTANEOUS
  Filled 2019-12-26: qty 1

## 2019-12-26 MED ORDER — INSULIN GLARGINE 100 UNIT/ML ~~LOC~~ SOLN
25.0000 [IU] | Freq: Every day | SUBCUTANEOUS | Status: DC
  Administered 2019-12-27 – 2019-12-28 (×2): 25 [IU] via SUBCUTANEOUS
  Filled 2019-12-26 (×2): qty 0.25

## 2019-12-26 NOTE — Progress Notes (Signed)
[facility] Progress Note  Patient: Richard [name]    PRF:[id number]  DOA: 12/20/2019     Date of Service: the patient was seen and examined on 12/26/2019  Chief Complaint  Patient presents with  . Post-op Problem   Brief hospital course: 29 y.o. male  with a past medical history of type 1 diabetes mellitus, resented with appendicitis under general surgery.  Patient developed sepsis and shock after surgery, had thermal injury to terminal ileum after appendectomy and patient was transferred to ICU.  Patient was stabilized and downgraded on medical floor on hospitalist service.  General surgery is following.  s/p exploratory laparotomy and primary repair of small bowel enterotomy with wound vac placement to abdominal wall following laparoscopic appedectomy, complicated by pertinent comorbidities including T1DM.   Currently further plan is to wait for bowel function return and advance diet gradually.  Assessment and Plan:  Abdominal pain: Improving slowly as patient has been passing gas for the past 24 hours and his abdominal pain has been improving as well -s/p appendicitis s/p exp lap thermal injury at terminal ileum -s/p NG tube with suction, removed on 12/25/19 -FLD started on 12/26/2019 -Adequate pain management -Care per primary team, general surgery  Shock: Resolved -Status post post op hypovolumic shock and sepsis -Respondingwell to IVF's and ABX;continue -Follow blood culture--so far no growth from 12/21/2019  ELECTROLYTEimbalance: Resolved -Replacement of mag as well as potassium -Status post replacement -follow labs as needed  Diabetes mellitus type 1:BG's started going up - 2/3 increased Lantus 25U, NovoLog 4 units 3 times daily with meals and SSI-sensitive with Q4hrs BG's  Hypoglycemic occasionallywhen NPO, resolved  AKI: Much improved  -Patient was admitted with 1.45 creatinine -Most likely due to prerenal after major surgery and decreased p.o.  intake -Improving with IV hydration -Continue to monitor -Avoid potential nephrotoxic agents  Body mass index is 37.91 kg/m.    Interventions:   Diet: Full liquid diabetic diet DVT Prophylaxis: Subcutaneous Lovenox   Advance goals of care discussion: Full code  Family Communication: No family was present at bedside, at the time of interview.  The pt provided permission to discuss medical plan with the family. Opportunity was given to ask question and all questions were answered satisfactorily.   Disposition:  Pt is from Home, admitted with acute appendicitis s/p appendectomy, s/p ex lap due to thermal injury to terminal ileum, still has not returned bowel function completely, which precludes a safe discharge. Discharge to home, when patient will be on regular diet and cleared by general surgery.  Subjective: Patient was seen and examined at bedside, pain is under control, patient is passing gas, had 3 BM yesterday.  No BM today yet. Wound VAC was changed today. Patient denied any chest pain or shortness of breath, no nausea vomiting  Physical Exam: General:  alert oriented to time, place, and person.  Appear in mild distress, affect appropriate Eyes: PERRL ENT: Oral Mucosa Clear, moist  Neck: no JVD,  Cardiovascular: S1 and S2 Present, no audible murmur,  Respiratory: good respiratory effort, Bilateral Air entry equal and Decreased, no Crackles, no wheezes Abdomen: Bowel Sound present, Soft and mild tenderness,  Skin: no rashes Extremities: no Pedal edema, no  calf tenderness Neurologic: without any new focal findings and mental status, alert and oriented x3 Gait not checked due to patient safety concerns  Vitals:   12/26/19 0404 12/26/19 0410 12/26/19 0824 12/26/19 1133  BP: 137/89  (!) 131/91 111/71  Pulse: 92  89 92  Resp:  18  14 20   Temp: 99 F (37.2 C)  98.1 F (36.7 C) 97.7 F (36.5 C)  TempSrc: Oral  Oral Oral  SpO2: 98%  97% 97%  Weight:  91 kg    Height:         Intake/Output Summary (Last 24 hours) at 12/26/2019 1302 Last data filed at 12/26/2019 1118 Gross per 24 hour  Intake 2309.85 ml  Output 900 ml  Net 1409.85 ml   Filed Weights   12/20/19 2120 12/21/19 1441 12/26/19 0410  Weight: 81.6 kg 72.4 kg 91 kg    Data Reviewed: I have personally reviewed and interpreted daily labs, tele strips, imagings as discussed above. I reviewed all nursing notes, pharmacy notes, vitals, pertinent old records I have discussed plan of care as described above with RN and patient/family.  CBC: Recent Labs  Lab 12/22/19 0427 12/22/19 0427 12/23/19 0509 12/23/19 1522 12/24/19 0521 12/25/19 0521 12/26/19 0714  WBC 4.3  --  5.5  --  6.6 5.8 6.6  NEUTROABS  --   --  4.2  --  4.9 3.9 3.6  HGB 13.0   < > 11.7* 11.3* 11.4* 11.1* 12.4*  HCT 40.1   < > 35.9* 34.3* 34.6* 34.4* 38.0*  MCV 95.5  --  95.0  --  95.1 95.3 93.8  PLT 154  --  179  --  179 184 214   < > = values in this interval not displayed.   Basic Metabolic Panel: Recent Labs  Lab 12/22/19 0427 12/23/19 0509 12/24/19 0521 12/25/19 0521 12/26/19 0714  NA 141 141 143 140 139  K 5.3* 4.2 3.9 3.8 3.5  CL 111 106 102 99 97*  CO2 24 29 31 27  32  GLUCOSE 150* 157* 103* 300* 260*  BUN 26* 24* 20 19 15   CREATININE 1.29* 1.31* 1.18 1.18 1.04  CALCIUM 7.1* 7.9* 8.1* 7.9* 7.9*  MG 2.4 2.3 2.2 1.8 1.9  PHOS 3.7 1.6* 2.2* 3.2 3.4    Studies: No results found.  Scheduled Meds: . acetaminophen  650 mg Oral Q6H  . ARIPiprazole  30 mg Oral QHS  . DULoxetine  90 mg Oral QHS  . enoxaparin (LOVENOX) injection  40 mg Subcutaneous Q24H  . famotidine  20 mg Oral BID  . insulin aspart  0-20 Units Subcutaneous TID WC  . insulin aspart  4 Units Subcutaneous TID WC  . [START ON 12/27/2019] insulin glargine  25 Units Subcutaneous Daily  . lamoTRIgine  200 mg Oral QHS  . pregabalin  300 mg Oral BID  . propranolol ER  60 mg Oral QHS  . sodium chloride flush  3 mL Intravenous Once  . sodium  chloride flush  3 mL Intravenous Q12H  . tiaGABine  8 mg Oral QHS   Continuous Infusions: . sodium chloride Stopped (12/26/19 0813)  . piperacillin-tazobactam (ZOSYN)  IV 3.375 g (12/26/19 0814)   PRN Meds: sodium chloride, benzonatate, HYDROmorphone (DILAUDID) injection, ondansetron **OR** ondansetron (ZOFRAN) IV, ondansetron (ZOFRAN) IV, oxyCODONE, sodium chloride flush, zolpidem  Time spent: 35 minutes  Author: 02/24/2020. MD [facility] 12/26/2019 1:02 PM  To reach On-call, see care teams to locate the attending and reach out to them via [email or web address]. If 7PM-7AM, please contact night-coverage If you still have difficulty reaching the attending provider, please page the [facility] (Director on Call) for [facility] on amion for assistance.

## 2019-12-26 NOTE — Progress Notes (Addendum)
[facility] Hospital Day(s): 5.   Post op day(s): 5 Days Post-Op.   Interval History:  Patient seen and examined No acute events or new complaints overnight.  Patient reports he is feeling better this morning Still with expected incisional soreness No fever, chills, nausea, or emesis NGT removed yesterday and started on CLD; tolerating well He is now having bowel function No surgical drain output recorded in chart PT evaluation recommends home PT, wheeled walker Due for wound vac change today   Vital signs in last 24 hours: [min-max] current  Temp:  [97.6 F (36.4 C)-99 F (37.2 C)] 99 F (37.2 C) (02/03 0404) Pulse Rate:  [92-103] 92 (02/03 0404) Resp:  [18-24] 18 (02/03 0404) BP: (131-137)/(78-89) 137/89 (02/03 0404) SpO2:  [92 %-98 %] 98 % (02/03 0404) Weight:  [91 kg] 91 kg (02/03 0410)     Height: 5\' 1"  (154.9 cm) Weight: 91 kg BMI (Calculated): 37.93   Intake/Output last 2 shifts:  02/02 0701 - 02/03 0700 In: 725.1 [I.V.:672; IV Piggyback:53.1] Out: 500 [Urine:500]   Physical Exam:  Constitutional: alert, cooperative and no distress  Respiratory: breathing non-labored at rest  Cardiovascular: normal rate and sinus rhythm  Gastrointestinal: soft, non-tender, and non-distended, no rebound/guarding. Blake drain in LLQ with serous output Integumentary: Wound vac to midline wound changed this morning. Wound base with healthy granulation tissue, wound measures 16 cm x 4 cm x 3 cm    Labs:  CBC Latest Ref Rng & Units 12/25/2019 12/24/2019 12/23/2019  WBC 4.0 - 10.5 K/uL 5.8 6.6 -  Hemoglobin 13.0 - 17.0 g/dL 11.1(L) 11.4(L) 11.3(L)  Hematocrit 39.0 - 52.0 % 34.4(L) 34.6(L) 34.3(L)  Platelets 150 - 400 K/uL 184 179 -   CMP Latest Ref Rng & Units 12/25/2019 12/24/2019 12/23/2019  Glucose 70 - 99 mg/dL 300(H) 103(H) 157(H)  BUN 6 - 20 mg/dL 19 20 24(H)  Creatinine 0.61 - 1.24 mg/dL 1.18 1.18 1.31(H)  Sodium 135 - 145 mmol/L 140 143 141   Potassium 3.5 - 5.1 mmol/L 3.8 3.9 4.2  Chloride 98 - 111 mmol/L 99 102 106  CO2 22 - 32 mmol/L 27 31 29   Calcium 8.9 - 10.3 mg/dL 7.9(L) 8.1(L) 7.9(L)  Total Protein 6.5 - 8.1 g/dL - - -  Total Bilirubin 0.3 - 1.2 mg/dL - - -  Alkaline Phos 38 - 126 U/L - - -  AST 15 - 41 U/L - - -  ALT 0 - 44 U/L - - -    Imaging studies: No new pertinent imaging studies   Assessment/Plan:  29 y.o. male overall improving with return of bowel function 5 Days Post-Op s/p exploratory laparotomy and primary repair of small bowel enterotomy with wound vac placement to abdominal wall following laparoscopic appedectomy, complicated by pertinent comorbidities including T1DM.   - Advance to full liquid diet; possible soft diet for dinner  - d/c IVF   - If tachycardia remains resolved we can discontinue cardiac monitoring  - Pain control prn; antiemetics prn             - Monitor abdominal examination    - Continue surgical drain; monitor and record output daily             - Wound vac changed this morning; MWF schedule; will start [place] orders             - Encouraged mobilization --> PT recommending HHPT             -  Medical management of comorbid conditions     - Discharge Planning: If tolerates advancement of diet then hopefully home in the next 48 hours once home health is established.    All of the above findings and recommendations were discussed with the patient, and the medical team, and all of patient's questions were answered to his expressed satisfaction.  -- Lynden Oxford, PA-C [facility] 12/26/2019, 7:11 AM [phone] M-F: 7am - 4pm  I saw and evaluated the patient.  I agree with the above documentation, exam, and plan, which I have edited where appropriate. Duanne Guess  12:32 PM

## 2019-12-26 NOTE — Progress Notes (Signed)
Pharmacy Electrolyte Monitoring Consult:  Pharmacy consulted to assist in monitoring and replacing electrolytes in this 29 y.o. male admitted on 12/20/2019 with Post-op Problem 1/28 s/p lap appendectomy   Labs:  Sodium (mmol/L)  Date Value  12/26/2019 139   Potassium (mmol/L)  Date Value  12/26/2019 3.5   Magnesium (mg/dL)  Date Value  75/91/6384 1.9   Phosphorus (mg/dL)  Date Value  66/59/9357 3.4   Calcium (mg/dL)  Date Value  01/77/9390 7.9 (L)   Albumin (g/dL)  Date Value  30/07/2329 4.0   SCr 1.45 >1.72-->1.04 Currently NPO except for meds D5-1/2NS with KCl 20 mEq at 75 ml/hr  Assessment/Plan: K 3.5, Mag 1.9, Phos 3.4  Scr 1.04 Current levels WNL, no additional supplementation needed. Recheck labs in AM  Pharmacy will continue to follow.   Bettey Costa 12/26/2019 9:36 AM

## 2019-12-26 NOTE — Progress Notes (Signed)
PT Cancellation Note  Patient Details Name: Richard [name] MRN: [id number] DOB: Apr 14, 1991   Cancelled Treatment:    Reason Eval/Treat Not Completed: Other (comment)(Pt politely declining PT at this time. Reported recent ambulation to bathroom, and administration of pain medication. Eager to attempt OOB/ambulation tomorrow. PT to follow up as able.)   Olga Coaster PT, DPT 6:02 PM,12/26/19

## 2019-12-26 NOTE — Progress Notes (Signed)
Inpatient Diabetes Program Recommendations  AACE/ADA: New Consensus Statement on Inpatient Glycemic Control (2015)  Target Ranges:  Prepandial:   less than 140 mg/dL      Peak postprandial:   less than 180 mg/dL (1-2 hours)      Critically ill patients:  140 - 180 mg/dL   Lab Results  Component Value Date   GLUCAP 297 (H) 12/26/2019   HGBA1C 7.3 (H) 11/14/2019    Review of Glycemic Control  Results for Richard, [name] (MRN [id number]) as of 12/26/2019 12:44  Ref. Range 12/25/2019 19:59 12/26/2019 00:01 12/26/2019 03:58 12/26/2019 07:30 12/26/2019 11:30  Glucose-Capillary Latest Ref Range: 70 - 99 mg/dL 573 (H) 220 (H) 254 (H) 251 (H) 297 (H)    Diabetes history: Type 1 DM (Does not produce insulin) Outpatient Diabetes medications: Insulin pump-Basal 1.25 units/hr + 1 unit / 6 CHO. 1 units drops appx 58ml/dl-Goal-120mg /dl. (takes Lantus 22 units if not attached to pump) Current orders for Inpatient glycemic control: Novolog 0-20 units TID with meals + Lantus 20 units daily  Inpatient Diabetes Program Recommendations:    -Novolog 4 units TID with meals if eats at least 50% of meal  -Lantus 25 units daily  Thank you, Dulce Sellar, RN, BSN Diabetes Coordinator Inpatient Diabetes Program [phone] (team pager from 8a-5p)

## 2019-12-27 ENCOUNTER — Other Ambulatory Visit: Payer: Medicare Other

## 2019-12-27 LAB — BASIC METABOLIC PANEL
Anion gap: 12 (ref 5–15)
BUN: 11 mg/dL (ref 6–20)
CO2: 32 mmol/L (ref 22–32)
Calcium: 8.2 mg/dL — ABNORMAL LOW (ref 8.9–10.3)
Chloride: 97 mmol/L — ABNORMAL LOW (ref 98–111)
Creatinine, Ser: 1.08 mg/dL (ref 0.61–1.24)
GFR calc Af Amer: >60 mL/min (ref >60)
GFR calc non Af Amer: >60 mL/min (ref >60)
Glucose, Bld: 158 mg/dL — ABNORMAL HIGH (ref 70–99)
Potassium: 3.3 mmol/L — ABNORMAL LOW (ref 3.5–5.1)
Sodium: 141 mmol/L (ref 135–145)

## 2019-12-27 LAB — GLUCOSE, CAPILLARY
Glucose-Capillary: 154 mg/dL — ABNORMAL HIGH (ref 70–99)
Glucose-Capillary: 142 mg/dL — ABNORMAL HIGH (ref 70–99)
Glucose-Capillary: 60 mg/dL — ABNORMAL LOW (ref 70–99)
Glucose-Capillary: 62 mg/dL — ABNORMAL LOW (ref 70–99)
Glucose-Capillary: 72 mg/dL (ref 70–99)
Glucose-Capillary: 163 mg/dL — ABNORMAL HIGH (ref 70–99)

## 2019-12-27 LAB — CBC
HCT: 37.7 % — ABNORMAL LOW (ref 39.0–52.0)
Hemoglobin: 12.3 g/dL — ABNORMAL LOW (ref 13.0–17.0)
MCH: 30.8 pg (ref 26.0–34.0)
MCHC: 32.6 g/dL (ref 30.0–36.0)
MCV: 94.5 fL (ref 80.0–100.0)
Platelets: 254 10*3/uL (ref 150–400)
RBC: 3.99 MIL/uL — ABNORMAL LOW (ref 4.22–5.81)
RDW: 13.6 % (ref 11.5–15.5)
WBC: 9.6 10*3/uL (ref 4.0–10.5)
nRBC: 0 % (ref 0.0–0.2)

## 2019-12-27 LAB — MAGNESIUM: Magnesium: 1.9 mg/dL (ref 1.7–2.4)

## 2019-12-27 LAB — PHOSPHORUS: Phosphorus: 4.3 mg/dL (ref 2.5–4.6)

## 2019-12-27 MED ORDER — POTASSIUM CHLORIDE CRYS ER 20 MEQ PO TBCR
40.0000 meq | EXTENDED_RELEASE_TABLET | Freq: Once | ORAL | Status: AC
  Administered 2019-12-27: 08:00:00 40 meq via ORAL
  Filled 2019-12-27: qty 2

## 2019-12-27 NOTE — Progress Notes (Signed)
Physical Therapy Treatment Patient Details Name: Richard [name] MRN: [id number] DOB: Nov 20, 1991 Today's Date: 12/27/2019    History of Present Illness Per MD notes: Pt is a 29 y.o. male s/p exploratory laparotomy and primary repair of small bowel enterotomy with wound vac placement to abdominal wall following laparoscopic appedectomy, complicated by pertinent comorbidities including T1DM.  Pt also s/p hypovolumic shock and sepsis as well as electrolyte imbalance which have resolved.  Pt with AKI most likely due to prerenal after major surgery and decreased p.o. intake.    PT Comments    Ready for session after receiving pain meds prior.  Out of bed and was able to walk complete lap of nursing unit with RW and min guard.  Prefers RW at this time for balance and confidence but no LOB or buckling.  Pt on 1 lpm at rest.  Sats 98%.  Talked with RN and ok to remove O2 for gait.  O2 93% after gait and it remained off on room air.   Follow Up Recommendations  Home health PT;Supervision - Intermittent     Equipment Recommendations  Rolling walker with 5" wheels    Recommendations for Other Services       Precautions / Restrictions Precautions Precautions: Other (comment) Precaution Comments: drain and woudn vac Restrictions Weight Bearing Restrictions: No Other Position/Activity Restrictions: HOB to >30 deg; abdominal wound with wound vac as well as JP drain in place    Mobility  Bed Mobility Overal bed mobility: Needs Assistance     Sidelying to sit: Min assist          Transfers Overall transfer level: Needs assistance Equipment used: None Transfers: Sit to/from Stand Sit to Stand: Supervision            Ambulation/Gait Ambulation/Gait assistance: Min guard Gait Distance (Feet): 200 Feet Assistive device: Rolling walker (2 wheeled) Gait Pattern/deviations: Step-through pattern Gait velocity: decreased   General Gait Details: did well.  prefers RW for balance  at this time   Marine scientist [place] (Stroke Patients Only)       Balance Overall balance assessment: Needs assistance Sitting-balance support: Feet supported Sitting balance-Leahy Scale: Good     Standing balance support: Bilateral upper extremity supported Standing balance-Leahy Scale: Good                              Cognition Arousal/Alertness: Awake/alert Behavior During Therapy: WFL for tasks assessed/performed Overall Cognitive Status: Within Functional Limits for tasks assessed                                        Exercises      General Comments        Pertinent Vitals/Pain Pain Assessment: Faces Faces Pain Scale: Hurts little more Pain Location: Abdominal pain Pain Descriptors / Indicators: Operative site guarding;Aching Pain Intervention(s): Monitored during session;Premedicated before session;Limited activity within patient's tolerance    Home Living                      Prior Function            PT Goals (current goals can now be found in the care plan section) Progress towards PT goals: Progressing toward goals    Frequency  Min 2X/week      PT Plan Current plan remains appropriate    Co-evaluation              AM-PAC PT "6 Clicks" Mobility   Outcome Measure  Help needed turning from your back to your side while in a flat bed without using bedrails?: A Little Help needed moving from lying on your back to sitting on the side of a flat bed without using bedrails?: A Little Help needed moving to and from a bed to a chair (including a wheelchair)?: A Little Help needed standing up from a chair using your arms (e.g., wheelchair or bedside chair)?: A Little Help needed to walk in hospital room?: A Little Help needed climbing 3-5 steps with a railing? : A Little 6 Click Score: 18    End of Session   Activity Tolerance: Patient tolerated treatment  well Patient left: in chair;with call bell/phone within reach;with chair alarm set Nurse Communication: Other (comment)       Time: 1610-9604 PT Time Calculation (min) (ACUTE ONLY): 14 min  Charges:  $Gait Training: 8-22 mins                    Danielle Dess, PTA 12/27/19, 12:22 PM '

## 2019-12-27 NOTE — Progress Notes (Signed)
Pharmacy Electrolyte Monitoring Consult:  Pharmacy consulted to assist in monitoring and replacing electrolytes in this 29 y.o. male admitted on 12/20/2019 with Post-op Problem 1/28 s/p lap appendectomy   Labs:  Sodium (mmol/L)  Date Value  12/27/2019 141   Potassium (mmol/L)  Date Value  12/27/2019 3.3 (L)   Magnesium (mg/dL)  Date Value  33/44/8301 1.9   Phosphorus (mg/dL)  Date Value  59/96/8957 4.3   Calcium (mg/dL)  Date Value  01/12/2668 8.2 (L)   Albumin (g/dL)  Date Value  16/75/6125 4.0   SCr 1.45 >1.72-->1.04 Currently NPO except for meds D5-1/2NS with KCl 20 mEq at 75 ml/hr  Assessment/Plan: Potassium 40 mEq PO x 1. Will follow up with morning labs.  Pharmacy will continue to follow.   Pricilla Riffle 12/27/2019 1:39 PM

## 2019-12-27 NOTE — Care Management Important Message (Signed)
Important Message  Patient Details  Name: Richard [name] MRN: [id number] Date of Birth: [birth year]   Medicare Important Message Given:  Yes     [name] 12/27/2019, 1:11 PM

## 2019-12-27 NOTE — Progress Notes (Addendum)
[place] Hospital Day(s): 6.   Post op day(s): 6 Days Post-Op.   Interval History:  Patient seen and examined No acute events or new complaints overnight.  Patient reports he is feeling better Abdominal pain controlled, no nausea, emesis or fevers Tachycardia resolved over last 24 hours.  Mild hypokalemia (3.3) otherwise labs normal, Hgb improved He advanced to full liquid diet without issues and continues to have bowel function Surgical drain with 230 ccs out in last 24 hours No new complaints this morning  Vital signs in last 24 hours: [min-max] current  Temp:  [97.5 F (36.4 C)-98.2 F (36.8 C)] 98.2 F (36.8 C) (02/04 0431) Pulse Rate:  [89-92] 89 (02/04 0431) Resp:  [14-20] 20 (02/04 0431) BP: (104-131)/(62-91) 108/65 (02/04 0431) SpO2:  [97 %-98 %] 97 % (02/04 0431) Weight:  [90.8 kg] 90.8 kg (02/04 0530)     Height: 5\' 1"  (154.9 cm) Weight: 90.8 kg BMI (Calculated): 37.84   Intake/Output last 2 shifts:  02/03 0701 - 02/04 0700 In: 2064.7 [P.O.:480; I.V.:1402.3; IV Piggyback:182.4] Out: 630 [Urine:400; Drains:230]   Physical Exam:  Constitutional: alert, cooperative and no distress  Respiratory: breathing non-labored at rest  Cardiovascular: normal rate and sinus rhythm  Gastrointestinal: soft, non-tender, and non-distended, no rebound/guarding. Blake drain in LLQ with serous output Integumentary: Wound vac to midline wound, good seal   Labs:  CBC Latest Ref Rng & Units 12/27/2019 12/26/2019 12/25/2019  WBC 4.0 - 10.5 K/uL 9.6 6.6 5.8  Hemoglobin 13.0 - 17.0 g/dL 12.3(L) 12.4(L) 11.1(L)  Hematocrit 39.0 - 52.0 % 37.7(L) 38.0(L) 34.4(L)  Platelets 150 - 400 K/uL 254 214 184   CMP Latest Ref Rng & Units 12/26/2019 12/25/2019 12/24/2019  Glucose 70 - 99 mg/dL 260(H) 300(H) 103(H)  BUN 6 - 20 mg/dL 15 19 20   Creatinine 0.61 - 1.24 mg/dL 1.04 1.18 1.18  Sodium 135 - 145 mmol/L 139 140 143  Potassium 3.5 - 5.1 mmol/L 3.5 3.8 3.9   Chloride 98 - 111 mmol/L 97(L) 99 102  CO2 22 - 32 mmol/L 32 27 31  Calcium 8.9 - 10.3 mg/dL 7.9(L) 7.9(L) 8.1(L)  Total Protein 6.5 - 8.1 g/dL - - -  Total Bilirubin 0.3 - 1.2 mg/dL - - -  Alkaline Phos 38 - 126 U/L - - -  AST 15 - 41 U/L - - -  ALT 0 - 44 U/L - - -     Imaging studies: No new pertinent imaging studies   Assessment/Plan:  28 y.o. male with mild hypokalemia but otherwise doing well 6 Days Post-Op s/p exploratory laparotomy and primary repair of small bowel enterotomy with wound vac placement to abdominal wall following laparoscopic appedectomy, complicated by pertinent comorbidities including T1DM   - Advance to soft diet   - Discontinue cardiac monitoring  - Pain control prn; antiemetics prn             - Monitor abdominal examination  - Will order KDUR for K+ repletion; re-check in am               - Continue surgical drain; monitor and record output daily             - Wound vac changed this morning; MWF schedule; HH established             - Encouraged mobilization --> PT recommending HHPT  - wean from O2             -  Medical management of comorbid conditions                 - Discharge Planning: If tolerates advancement of diet then hopefully home tomorrow (02/05)   All of the above findings and recommendations were discussed with the patient, and the medical team, and all   -- Lynden Oxford, PA-C [facility] 12/27/2019, 7:27 AM [phone] M-F: 7am - 4pm  I saw and evaluated the patient.  I agree with the above documentation, exam, and plan, which I have edited where appropriate. Duanne Guess  10:30 AM

## 2019-12-27 NOTE — Progress Notes (Signed)
[facility] Progress Note  Patient: Richard [name]    WNI:[id number]  DOA: 12/20/2019     Date of Service: the patient was seen and examined on 12/27/2019  Chief Complaint  Patient presents with  . Post-op Problem   Brief hospital course: 29 y.o. male  with a past medical history of type 1 diabetes mellitus, resented with appendicitis under general surgery.  Patient developed sepsis and shock after surgery, had thermal injury to terminal ileum after appendectomy and patient was transferred to ICU.  Patient was stabilized and downgraded on medical floor on hospitalist service.  General surgery is following.  s/p exploratory laparotomy and primary repair of small bowel enterotomy with wound vac placement to abdominal wall following laparoscopic appedectomy, complicated by pertinent comorbidities including T1DM.   Currently further plan is to wait for diet tolerance and possible discharge tomorrow   Assessment and Plan:  Abdominal pain: Improving slowly as patient has been passing gas for the past 24 hours and his abdominal pain has been improving as well -s/p appendicitis s/p exp lap thermal injury at terminal ileum -s/p NG tube with suction, removed on 12/25/19 -FLD started on 12/26/2019 -Adequate pain management -Care per primary team, general surgery  Shock: Resolved -Status post post op hypovolumic shock and sepsis -Respondingwell to IVF's and ABX;continue -Follow blood culture--so far no growth from 12/21/2019  ELECTROLYTEimbalance: Resolved -Replacement of mag as well as potassium -Status post replacement -follow labs as needed  Diabetes mellitus type 1:BG's started going up - 2/3 increased Lantus 25U, NovoLog 4 units 3 times daily with meals and SSI-sensitive with Q4hrs BG's  Hypoglycemic occasionallywhen NPO, resolved  AKI: Much improved  -Patient was admitted with 1.45 creatinine -Most likely due to prerenal after major surgery and decreased p.o.  intake -Improving with IV hydration -Continue to monitor -Avoid potential nephrotoxic agents  Body mass index is 37.82 kg/m.    Interventions:   Diet: Full liquid diabetic diet advanced to soft diet today DVT Prophylaxis: Subcutaneous Lovenox   Advance goals of care discussion: Full code  Family Communication: No family was present at bedside, at the time of interview.  The pt provided permission to discuss medical plan with the family. Opportunity was given to ask question and all questions were answered satisfactorily.   Disposition:  Pt is from Home, admitted with acute appendicitis s/p appendectomy, s/p ex lap due to thermal injury to terminal ileum, bowel function returning gradually, diet advanced today.  Awaiting for diet tolerance, which precludes a safe discharge. Discharge to home, when patient will be on regular diet and cleared by general surgery.  Subjective: Patient was seen and examined at bedside, pain is under control, patient is passing gas, had 3 BM yesterday and the day before.  Abdominal pain is off and on Wound VAC needs to be changed MWF schedule, home health established. Patient denied any chest pain or shortness of breath, no nausea vomiting  Physical Exam: General:  alert oriented to time, place, and person.  Appear in mild distress, affect appropriate Eyes: PERRL ENT: Oral Mucosa Clear, moist  Neck: no JVD,  Cardiovascular: S1 and S2 Present, no audible murmur,  Respiratory: good respiratory effort, Bilateral Air entry equal and Decreased, no Crackles, no wheezes Abdomen: Bowel Sound present, Soft and mild tenderness, wound VAC attached Skin: no rashes Extremities: no Pedal edema, no  calf tenderness Neurologic: without any new focal findings and mental status, alert and oriented x3 Gait not checked due to patient safety concerns  Vitals:  12/26/19 1947 12/27/19 0431 12/27/19 0530 12/27/19 1130  BP: 104/62 108/65  104/65  Pulse: 92 89  87   Resp: 20 20    Temp: (!) 97.5 F (36.4 C) 98.2 F (36.8 C)  (!) 97.5 F (36.4 C)  TempSrc: Oral Oral  Oral  SpO2: 98% 97%  98%  Weight:   90.8 kg   Height:        Intake/Output Summary (Last 24 hours) at 12/27/2019 1455 Last data filed at 12/27/2019 1230 Gross per 24 hour  Intake 1080 ml  Output 200 ml  Net 880 ml   Filed Weights   12/21/19 1441 12/26/19 0410 12/27/19 0530  Weight: 72.4 kg 91 kg 90.8 kg    Data Reviewed: I have personally reviewed and interpreted daily labs, tele strips, imagings as discussed above. I reviewed all nursing notes, pharmacy notes, vitals, pertinent old records I have discussed plan of care as described above with RN and patient/family.  CBC: Recent Labs  Lab 12/23/19 0509 12/23/19 0509 12/23/19 1522 12/24/19 0521 12/25/19 0521 12/26/19 0714 12/27/19 0618  WBC 5.5  --   --  6.6 5.8 6.6 9.6  NEUTROABS 4.2  --   --  4.9 3.9 3.6  --   HGB 11.7*   < > 11.3* 11.4* 11.1* 12.4* 12.3*  HCT 35.9*   < > 34.3* 34.6* 34.4* 38.0* 37.7*  MCV 95.0  --   --  95.1 95.3 93.8 94.5  PLT 179  --   --  179 184 214 254   < > = values in this interval not displayed.   Basic Metabolic Panel: Recent Labs  Lab 12/23/19 0509 12/24/19 0521 12/25/19 0521 12/26/19 0714 12/27/19 0618  NA 141 143 140 139 141  K 4.2 3.9 3.8 3.5 3.3*  CL 106 102 99 97* 97*  CO2 29 31 27  32 32  GLUCOSE 157* 103* 300* 260* 158*  BUN 24* 20 19 15 11   CREATININE 1.31* 1.18 1.18 1.04 1.08  CALCIUM 7.9* 8.1* 7.9* 7.9* 8.2*  MG 2.3 2.2 1.8 1.9 1.9  PHOS 1.6* 2.2* 3.2 3.4 4.3    Studies: No results found.  Scheduled Meds: . acetaminophen  650 mg Oral Q6H  . ARIPiprazole  30 mg Oral QHS  . DULoxetine  90 mg Oral QHS  . enoxaparin (LOVENOX) injection  40 mg Subcutaneous Q24H  . famotidine  20 mg Oral BID  . insulin aspart  0-20 Units Subcutaneous TID WC  . insulin aspart  0-5 Units Subcutaneous QHS  . insulin aspart  4 Units Subcutaneous TID WC  . insulin glargine  25 Units  Subcutaneous Daily  . lamoTRIgine  200 mg Oral QHS  . pregabalin  300 mg Oral BID  . propranolol ER  60 mg Oral QHS  . sodium chloride flush  3 mL Intravenous Once  . sodium chloride flush  3 mL Intravenous Q12H  . tiaGABine  8 mg Oral QHS   Continuous Infusions: . sodium chloride 250 mL (12/27/19 0820)  . piperacillin-tazobactam (ZOSYN)  IV 3.375 g (12/27/19 0821)   PRN Meds: sodium chloride, benzonatate, HYDROmorphone (DILAUDID) injection, ondansetron **OR** ondansetron (ZOFRAN) IV, ondansetron (ZOFRAN) IV, oxyCODONE, sodium chloride flush, zolpidem  Time spent: 35 minutes  Author: 02/24/20. MD [facility] 12/27/2019 2:55 PM  To reach On-call, see care teams to locate the attending and reach out to them via [email or web address]. If 7PM-7AM, please contact night-coverage If you still have difficulty reaching the attending provider, please page the  DOC (Director on Call) for [facility] on amion for assistance.

## 2019-12-28 LAB — CBC
HCT: 35.3 % — ABNORMAL LOW (ref 39.0–52.0)
Hemoglobin: 11.6 g/dL — ABNORMAL LOW (ref 13.0–17.0)
MCH: 30.9 pg (ref 26.0–34.0)
MCHC: 32.9 g/dL (ref 30.0–36.0)
MCV: 93.9 fL (ref 80.0–100.0)
Platelets: 262 10*3/uL (ref 150–400)
RBC: 3.76 MIL/uL — ABNORMAL LOW (ref 4.22–5.81)
RDW: 13.5 % (ref 11.5–15.5)
WBC: 10 10*3/uL (ref 4.0–10.5)
nRBC: 0 % (ref 0.0–0.2)

## 2019-12-28 LAB — GLUCOSE, CAPILLARY
Glucose-Capillary: 101 mg/dL — ABNORMAL HIGH (ref 70–99)
Glucose-Capillary: 80 mg/dL (ref 70–99)
Glucose-Capillary: 82 mg/dL (ref 70–99)

## 2019-12-28 LAB — BASIC METABOLIC PANEL
Anion gap: 6 (ref 5–15)
BUN: 10 mg/dL (ref 6–20)
CO2: 36 mmol/L — ABNORMAL HIGH (ref 22–32)
Calcium: 7.8 mg/dL — ABNORMAL LOW (ref 8.9–10.3)
Chloride: 97 mmol/L — ABNORMAL LOW (ref 98–111)
Creatinine, Ser: 1.16 mg/dL (ref 0.61–1.24)
GFR calc Af Amer: >60 mL/min (ref >60)
GFR calc non Af Amer: >60 mL/min (ref >60)
Glucose, Bld: 114 mg/dL — ABNORMAL HIGH (ref 70–99)
Potassium: 3.5 mmol/L (ref 3.5–5.1)
Sodium: 139 mmol/L (ref 135–145)

## 2019-12-28 MED ORDER — AMOXICILLIN-POT CLAVULANATE 875-125 MG PO TABS: 1.0000 | ORAL_TABLET | Freq: Two times a day (BID) | ORAL | 0 refills | Status: AC

## 2019-12-28 MED ORDER — POTASSIUM CHLORIDE CRYS ER 20 MEQ PO TBCR
40.0000 meq | EXTENDED_RELEASE_TABLET | Freq: Once | ORAL | Status: AC
  Administered 2019-12-28: 40 meq via ORAL
  Filled 2019-12-28: qty 2

## 2019-12-28 MED ORDER — OXYCODONE HCL 5 MG PO TABS: 5.0000 mg | ORAL_TABLET | ORAL | 0 refills | Status: DC | PRN

## 2019-12-28 NOTE — Progress Notes (Signed)
Physical Therapy Treatment Patient Details Name: Richard [name] MRN: [id number] DOB: May 21, 1991 Today's Date: 12/28/2019    History of Present Illness Per MD notes: Pt is a 29 y.o. male s/p exploratory laparotomy and primary repair of small bowel enterotomy with wound vac placement to abdominal wall following laparoscopic appedectomy, complicated by pertinent comorbidities including T1DM.  Pt also s/p hypovolumic shock and sepsis as well as electrolyte imbalance which have resolved.  Pt with AKI most likely due to prerenal after major surgery and decreased p.o. intake.    PT Comments    Pt ready for gait, hoping for discharge home.  Bed mobility and gait x 1 around unit with RW and supervision.  Overall tolerated well but some increased fatigue today and a backwards stagger while trying to adjust linens recovered with min a x 1.  Education for safety with mobility and energy conservation techniques and awareness.   Follow Up Recommendations  Home health PT;Supervision - Intermittent     Equipment Recommendations       Recommendations for Other Services       Precautions / Restrictions Precautions Precautions: Other (comment) Precaution Comments: drain and woudn vac Restrictions Weight Bearing Restrictions: No    Mobility  Bed Mobility Overal bed mobility: Needs Assistance Bed Mobility: Rolling;Sidelying to Sit;Sit to Sidelying Rolling: Supervision Sidelying to sit: Supervision     Sit to sidelying: Supervision    Transfers Overall transfer level: Needs assistance Equipment used: Rolling walker (2 wheeled) Transfers: Sit to/from Stand Sit to Stand: Supervision            Ambulation/Gait Ambulation/Gait assistance: Min guard Gait Distance (Feet): 200 Feet Assistive device: Rolling walker (2 wheeled) Gait Pattern/deviations: Step-through pattern Gait velocity: decreased   General Gait Details: did well.  prefers RW for balance at this time.   Stairs              Wheelchair Mobility    Modified Rankin (Stroke Patients Only)       Balance Overall balance assessment: Needs assistance Sitting-balance support: Feet supported Sitting balance-Leahy Scale: Good     Standing balance support: Bilateral upper extremity supported Standing balance-Leahy Scale: Good                              Cognition Arousal/Alertness: Awake/alert Behavior During Therapy: WFL for tasks assessed/performed Overall Cognitive Status: Within Functional Limits for tasks assessed                                        Exercises      General Comments        Pertinent Vitals/Pain Pain Assessment: Faces Faces Pain Scale: Hurts even more Pain Location: Abdominal pain Pain Descriptors / Indicators: Operative site guarding;Aching Pain Intervention(s): Monitored during session;Repositioned    Home Living                      Prior Function            PT Goals (current goals can now be found in the care plan section) Progress towards PT goals: Progressing toward goals    Frequency    Min 2X/week      PT Plan Current plan remains appropriate    Co-evaluation              AM-PAC PT "6 Clicks"  Mobility   Outcome Measure  Help needed turning from your back to your side while in a flat bed without using bedrails?: A Little Help needed moving from lying on your back to sitting on the side of a flat bed without using bedrails?: A Little Help needed moving to and from a bed to a chair (including a wheelchair)?: A Little Help needed standing up from a chair using your arms (e.g., wheelchair or bedside chair)?: A Little Help needed to walk in hospital room?: A Little Help needed climbing 3-5 steps with a railing? : A Little 6 Click Score: 18    End of Session Equipment Utilized During Treatment: Gait belt Activity Tolerance: Patient tolerated treatment well Patient left: in bed;with call  bell/phone within reach;with bed alarm set         Time: 1610-9604 PT Time Calculation (min) (ACUTE ONLY): 10 min  Charges:  $Gait Training: 8-22 mins                    Danielle Dess, PTA 12/28/19, 9:20 AM

## 2019-12-28 NOTE — Progress Notes (Signed)
Pharmacy Electrolyte Monitoring Consult:  Pharmacy consulted to assist in monitoring and replacing electrolytes in this 29 y.o. male admitted on 12/20/2019 with Post-op Problem 1/28 s/p lap appendectomy   Labs:  Sodium (mmol/L)  Date Value  12/28/2019 139   Potassium (mmol/L)  Date Value  12/28/2019 3.5   Magnesium (mg/dL)  Date Value  81/44/8185 1.9   Phosphorus (mg/dL)  Date Value  63/14/9702 4.3   Calcium (mg/dL)  Date Value  63/78/5885 7.8 (L)   Albumin (g/dL)  Date Value  02/77/4128 4.0   SCr 1.45 >1.72-->1.16 Currently NPO except for meds   Assessment/Plan:  Fluids have been discontinued. Potassium 40 mEq PO x 1. Will follow up with morning labs.  Pharmacy will continue to follow.   Bettey Costa 12/28/2019 7:31 AM

## 2019-12-28 NOTE — TOC Transition Note (Signed)
Transition of Care [facility]) - CM/SW Discharge Note   Patient Details  Name: Nhat Hearne MRN: [id number] Date of Birth: 1991-02-17  Transition of [facility]) CM/SW Contact:  Margarito Liner, LCSW Phone Number: 12/28/2019, 11:29 AM   Clinical Narrative:  Patient has orders to discharge home today. Home wound vac will be placed today. [facility] will start services on Monday. [employer] is aware of walker order and will deliver to the unit. Patient is aware and stated he will have a ride home today. No further concerns. CSW signing off.   Final next level of care: Home w Home Health Services Barriers to Discharge: Barriers Resolved   Patient Goals and CMS Choice   CMS Medicare.gov Compare Post Acute Care list provided to:: Patient Choice offered to / list presented to : Patient  Discharge Placement                    Patient and family notified of of transfer: 12/28/19  Discharge Plan and Services     Post Acute Care Choice: Durable Medical Equipment, Home Health          DME Arranged: Walker rolling, Negative pressure wound device DME Agency: [facility] Date DME Agency Contacted: 12/28/19   Representative spoke with at DME Agency: Mitchell Heir HH Arranged: RN, PT [facility] Agency: [facility] Date HH Agency Contacted: 12/28/19   Representative spoke with at [facility] Agency: Feliberto Gottron  Social Determinants of Health (SDOH) Interventions     Readmission Risk Interventions Readmission Risk Prevention Plan 12/27/2019 12/27/2019  PCP or Specialist Appt within 3-5 Days Complete -  HRI or Home Care Consult - Complete  Palliative Care Screening - Not Applicable

## 2019-12-28 NOTE — Progress Notes (Addendum)
[facility] SURGICAL PROGRESS NOTE  Hospital Day(s): 7.   Post op day(s): 7 Days Post-Op.   Interval History: Patient seen and examined, no acute events or new complaints overnight. Patient reports he is feeling good. He has intermittent sharp pains in his lower abdomen which is improved with pain medications. No fever, chills, nausea, or emesis. He has tolerated soft diet and continues to have bowel function. Hypokalemia resolved. No new issues. Plan for discharge today.   Vital signs in last 24 hours: [min-max] current  Temp:  [97.5 F (36.4 C)-98.2 F (36.8 C)] 97.8 F (36.6 C) (02/05 0346) Pulse Rate:  [85-96] 96 (02/05 0346) Resp:  [20] 20 (02/05 0346) BP: (98-121)/(58-75) 113/69 (02/05 0346) SpO2:  [90 %-98 %] 90 % (02/05 0346)     Height: 5\' 1"  (154.9 cm) Weight: 90.8 kg BMI (Calculated): 37.84   Intake/Output last 2 shifts:  02/04 0701 - 02/05 0700 In: 840 [P.O.:840] Out: 100 [Drains:100]   Physical Exam:  Constitutional: alert, cooperative and no distress  Respiratory: breathing non-labored at rest  Cardiovascular: normal rate and sinus rhythm  Gastrointestinal: soft, non-tender, and non-distended, no rebound/guarding. Blake drain in LLQ with serous output Integumentary: Wound vac to midline wound, good seal   Labs:  CBC Latest Ref Rng & Units 12/28/2019 12/27/2019 12/26/2019  WBC 4.0 - 10.5 K/uL 10.0 9.6 6.6  Hemoglobin 13.0 - 17.0 g/dL 11.6(L) 12.3(L) 12.4(L)  Hematocrit 39.0 - 52.0 % 35.3(L) 37.7(L) 38.0(L)  Platelets 150 - 400 K/uL 262 254 214   CMP Latest Ref Rng & Units 12/28/2019 12/27/2019 12/26/2019  Glucose 70 - 99 mg/dL 02/23/2020) 001(V) 494(W)  BUN 6 - 20 mg/dL 10 11 15   Creatinine 0.61 - 1.24 mg/dL 967(R 9.16  Sodium 135 - 145 mmol/L 139 141 139  Potassium 3.5 - 5.1 mmol/L 3.5 3.3(L) 3.5  Chloride 98 - 111 mmol/L 97(L) 97(L) 97(L)  CO2 22 - 32 mmol/L 36(H) 32 32  Calcium 8.9 - 10.3 mg/dL 7.8(L) 8.2(L) 7.9(L)  Total Protein 6.5 - 8.1 g/dL - -  -  Total Bilirubin 0.3 - 1.2 mg/dL - - -  Alkaline Phos 38 - 126 U/L - - -  AST 15 - 41 U/L - - -  ALT 0 - 44 U/L - - -     Imaging studies: No new pertinent imaging studies   Assessment/Plan:  29 y.o. male overall doing well and ready for discharge 7 Days Post-Op s/p exploratory laparotomy and primary repair of small bowel enterotomy with wound vac placement to abdominal wall following laparoscopic appedectomy, complicated by pertinent comorbidities including T1DM   - Patient is stable for discharge form a surgery standpoint...   - I will place home wound vac today once it arrives   - Augmentin x1 week to complete 14 days total   - I will placed Rx for pain control as well   - We will remove surgical drain before discharge   - Reviewed and placed post-op instructions in chart    - Follow up with Dr 6.65 or myself next week  All of the above findings and recommendations were discussed with the patient, and the medical team, and all of patient's questions were answered to his expressed satisfaction.  -- 30, PA-C  [facility] 12/28/2019, 7:21 AM [phone] M-F: 7am - 4pm  I saw and evaluated the patient.  I agree with the above documentation, exam, and plan, which I have edited where appropriate. 02/25/2020  12:02  PM   

## 2019-12-28 NOTE — Progress Notes (Signed)
12/28/2019 1445  Richard [name] to be D/C'd Home per MD order.  Discussed prescriptions and follow up appointments with the patient. Prescriptions given to patient, medication list explained in detail. Pt verbalized understanding.  Allergies as of 12/28/2019      Reactions   Sulfa Antibiotics Anaphylaxis      Medication List    STOP taking these medications   HYDROcodone-acetaminophen 5-325 MG tablet Commonly known as: NORCO/VICODIN     TAKE these medications   amoxicillin-clavulanate 875-125 MG tablet Commonly known as: Augmentin Take 1 tablet by mouth 2 (two) times daily for 7 days. Notes to patient: Night 12/28/19   ARIPiprazole 30 MG tablet Commonly known as: ABILIFY Take 30 mg by mouth at bedtime. Notes to patient: Before bed 12/28/19   DULoxetine 30 MG capsule Commonly known as: CYMBALTA Take 90 mg by mouth at bedtime. Notes to patient: Before bed 12/28/19   HumaLOG 100 UNIT/ML injection Generic drug: insulin lispro BASAL RATE 1.25 UNITS/HR Notes to patient: According to established home schedule   Insulin Glargine 100 UNIT/ML Solostar Pen Commonly known as: LANTUS Off-pump: Lantus/Levemir or Basaglar pen: 22 u as soon as off pump/ea.24 hrs off pump. Restart pump after 20 hrs from last longacting shot. Notes to patient: According to established home schedule   lamoTRIgine 100 MG tablet Commonly known as: LAMICTAL Take 200 mg by mouth at bedtime. Notes to patient: Before bed 12/28/19   ondansetron 4 MG disintegrating tablet Commonly known as: Zofran ODT Take 1 tablet (4 mg total) by mouth every 8 (eight) hours as needed for nausea or vomiting. Notes to patient: As needed   oxyCODONE 5 MG immediate release tablet Commonly known as: Oxy IR/ROXICODONE Take 1 tablet (5 mg total) by mouth every 4 (four) hours as needed for severe pain or breakthrough pain. Notes to patient: As needed after 4:20PM 12/28/19   pregabalin 300 MG capsule Commonly known as: LYRICA Take 300  mg by mouth 2 (two) times daily. Notes to patient: Before bed 12/28/19   propranolol ER 60 MG 24 hr capsule Commonly known as: INDERAL LA Take 60 mg by mouth at bedtime. Notes to patient: Before bed 12/28/19   tiaGABine 4 MG tablet Commonly known as: GABITRIL Take 8 mg by mouth at bedtime. Notes to patient: Before bed 12/28/19   zolpidem 10 MG tablet Commonly known as: AMBIEN Take 10 mg by mouth at bedtime as needed. Notes to patient: As needed            Durable Medical Equipment  (From admission, onward)         Start     Ordered   12/27/19 1321  For home use only DME Walker rolling  Once    Question Answer Comment  Walker: With 5 Inch Wheels   Patient needs a walker to treat with the following condition Physical deconditioning      12/27/19 1320          Vitals:   12/28/19 0346 12/28/19 1204  BP: 113/69 112/66  Pulse: 96 96  Resp: 20 16  Temp: 97.8 F (36.6 C) 98.2 F (36.8 C)  SpO2: 90% 93%    Skin clean, dry and intact without evidence of skin break down, no evidence of skin tears noted. IV catheter discontinued intact. Site without signs and symptoms of complications. Dressing and pressure applied. Pt denies pain at this time. No complaints noted.  An After Visit Summary was printed and given to the patient. Patient escorted via WC,  and D/C home via private auto.  Richard [name]

## 2019-12-28 NOTE — Discharge Summary (Signed)
[facility] Discharge Summary   Patient: Richard [name] FIE:[id number]  PCP: System, Pcp Not In  Date of admission: 12/20/2019   Date of discharge:  12/28/2019     Discharge Diagnoses:   Active Problems:   Post-operative pain   Intraoperative perforation of small bowel   Nausea   Abdominal distention   Acute abdomen   Septic shock (HCC)   Admitted From: Home Disposition:  Home   Recommendations for Outpatient Follow-up:  1. PCP: In 1 week 2. Follow-up with general surgery next week for postop check and wound VAC management  Follow-up Information    [facility] Follow [facility].   Specialty: Home Health Services Why: They will follow up with you for your home health needs (Nursing and physical therapy). They will start services on Monday 2/8.       Duanne Guess, MD. Schedule an appointment as soon as possible for a visit in 1 week(s).   Specialty: General Surgery Why: follow up next week with Dr Lady Gary, s/p exploratory laparotomy Contact information: [address] [place] [address] [phone]          Diet recommendation: Carb modified diet  Activity: The patient is advised to gradually reintroduce usual activities, as tolerated  Discharge Condition: stable  Code Status: Full code   History of present illness: As per the H and P dictated on admission. Hospital Course:  29 y.o.male with a past medical history of type 1 diabetes mellitus, resented with appendicitis under general surgery.  Patient developed sepsis and shock after surgery, had thermal injury to terminal ileum after appendectomy and patient was transferred to ICU.  Patient was stabilized and downgraded on medical floor on hospitalist service.  General surgery is following.  s/p exploratory laparotomy and primary repair of small bowel enterotomy with wound vac placement to abdominal wall following laparoscopic appedectomy, complicated by pertinent comorbidities  includingT1DM.  Assessment and Plan: Abdominal pain: Improving slowly as patient has been passing gas for the past 24 hours and his abdominal pain has been improving as well -s/p appendicitis s/p exp lap thermal injury at terminal ileum. s/p NG tube with suction, removed on 2/2/2, FLD startedon 12/26/2019 gradually diet was advanced to soft diet patient was able to tolerate adequately.  Advised to take small meals frequent rather than a large meal as patient was feeling bloated.  Patient was prescribed oxycodone immediate release by general surgery.  Cleared by general surgery to discharge and follow-up as an outpatient.  Recommended to continue wound VAC.  Home care services arranged.   S/p Shock: Resolved, -Status post post op hypovolumic shock and sepsis -Respondingwell to IVF's and ABX;s/p Zosyn during hospital stay, general surgery recommended to continue Augmentin for 7 additional days. blood culture--so far no growth from 12/21/2019 ELECTROLYTEimbalance: Resolved, -Replacement of mag and potassium was was done. Diabetes mellitus type 1: - 2/3 increased Lantus 25U, NovoLog 4 units 3 times daily with meals and SSI-sensitive with Q4hrs BG's, Hypoglycemic occasionallywhen NPO, resolved.  Patient was advised to continue monitor blood glucose at home and titrate dose of insulin accordingly.  Follow-up with PCP for further management. TFT:DDUKGURK -Patient was admitted with 1.45 creatinine, Most likely due to prerenal after major surgery and decreased p.o. intake, Resolved after IV hydration Body mass index is 37.82 kg/m.  Nutrition Interventions: Currently diet advised, advised to continue diabetic diet. Oxycodone for pain control were prescribed by general surgery. - [place] Controlled Substance Reporting System database was not reviewed by me  reviewed. - Patient was instructed, not to drive, operate heavy machinery, perform activities at heights, swimming or participation in water  activities or provide baby sitting services while on Pain, Sleep and Anxiety Medications; until his outpatient Physician has advised to do so again.  - Also recommended to not to take more than prescribed Pain, Sleep and Anxiety Medications.  Patient was seen by physical therapy, who recommended Home health, and Home PT which was arranged. On the day of the discharge the patient's vitals were stable, and no other acute medical condition were reported by patient. the patient was felt safe to be discharge at Home with Home health.  Consultants: General surgery Procedures: s/p appendicitis and s/p exp lap due to thermal injury to terminal ileum.   Discharge Exam: General: Appear in mild distress, no Rash; Oral Mucosa Clear, moist. Cardiovascular: S1 and S2 Present, no Murmur, Respiratory: normal respiratory effort, Bilateral Air entry present and no Crackles, no wheezes Abdomen: Bowel Sound present, Soft and mild tenderness post op, wound VAC attached, no hernia Extremities: no Pedal edema, no calf tenderness Neurology: alert and oriented to time, place, and person affect appropriate.  Filed Weights   12/21/19 1441 12/26/19 0410 12/27/19 0530  Weight: 72.4 kg 91 kg 90.8 kg   Vitals:   12/27/19 2212 12/28/19 0346  BP: 121/75 113/69  Pulse: 96 96  Resp:  20  Temp:  97.8 F (36.6 C)  SpO2:  90%    DISCHARGE MEDICATION: Allergies as of 12/28/2019      Reactions   Sulfa Antibiotics Anaphylaxis      Medication List    STOP taking these medications   HYDROcodone-acetaminophen 5-325 MG tablet Commonly known as: NORCO/VICODIN     TAKE these medications   amoxicillin-clavulanate 875-125 MG tablet Commonly known as: Augmentin Take 1 tablet by mouth 2 (two) times daily for 7 days.   ARIPiprazole 30 MG tablet Commonly known as: ABILIFY Take 30 mg by mouth at bedtime.   DULoxetine 30 MG capsule Commonly known as: CYMBALTA Take 90 mg by mouth at bedtime.   HumaLOG 100 UNIT/ML  injection Generic drug: insulin lispro BASAL RATE 1.25 UNITS/HR   Insulin Glargine 100 UNIT/ML Solostar Pen Commonly known as: LANTUS Off-pump: Lantus/Levemir or Basaglar pen: 22 u as soon as off pump/ea.24 hrs off pump. Restart pump after 20 hrs from last longacting shot.   lamoTRIgine 100 MG tablet Commonly known as: LAMICTAL Take 200 mg by mouth at bedtime.   ondansetron 4 MG disintegrating tablet Commonly known as: Zofran ODT Take 1 tablet (4 mg total) by mouth every 8 (eight) hours as needed for nausea or vomiting.   oxyCODONE 5 MG immediate release tablet Commonly known as: Oxy IR/ROXICODONE Take 1 tablet (5 mg total) by mouth every 4 (four) hours as needed for severe pain or breakthrough pain.   pregabalin 300 MG capsule Commonly known as: LYRICA Take 300 mg by mouth 2 (two) times daily.   propranolol ER 60 MG 24 hr capsule Commonly known as: INDERAL LA Take 60 mg by mouth at bedtime.   tiaGABine 4 MG tablet Commonly known as: GABITRIL Take 8 mg by mouth at bedtime.   zolpidem 10 MG tablet Commonly known as: AMBIEN Take 10 mg by mouth at bedtime as needed.            Durable Medical Equipment  (From admission, onward)         Start     Ordered   12/27/19 1321  For  home use only DME Walker rolling  Once    Question Answer Comment  Walker: With 5 Inch Wheels   Patient needs a walker to treat with the following condition Physical deconditioning      12/27/19 1320         Allergies  Allergen Reactions  . Sulfa Antibiotics Anaphylaxis   Discharge Instructions    Diet - low sodium heart healthy   Complete by: As directed    Discharge instructions   Complete by: As directed    Follow-up with PCP in 1 week, continue to monitor blood pressure at home. Follow-up with general surgery in 1 week as an outpatient.   Increase activity slowly   Complete by: As directed       The results of significant diagnostics from this hospitalization (including  imaging, microbiology, ancillary and laboratory) are listed below for reference.    Significant Diagnostic Studies: CT ABDOMEN PELVIS W CONTRAST  Result Date: 12/21/2019 CLINICAL DATA:  Lower abdominal pain following appendectomy earlier today EXAM: CT ABDOMEN AND PELVIS WITH CONTRAST TECHNIQUE: Multidetector CT imaging of the abdomen and pelvis was performed using the standard protocol following bolus administration of intravenous contrast. CONTRAST:  OMNIPAQUE IOHEXOL 300 MG/ML  SOLN COMPARISON:  11/16/2019 FINDINGS: Lower chest: Linear areas of subsegmental atelectasis in the lower lobes. Hepatobiliary: Small layering gallstones within the gallbladder. No focal hepatic abnormality. Pancreas: No focal abnormality or ductal dilatation. Spleen: No focal abnormality.  Normal size. Adrenals/Urinary Tract: No adrenal abnormality. No focal renal abnormality. No stones or hydronephrosis. Urinary bladder is unremarkable. Stomach/Bowel: Postoperative changes from appendectomy. Stranding in some wall thickening noted in the region of the cecum, likely related to today's surgery. No evidence of bowel obstruction. Stomach and small bowel decompressed, unremarkable. Moderate stool in the colon. Vascular/Lymphatic: No evidence of aneurysm or adenopathy. Reproductive: No visible focal abnormality. Other: Moderate free air and free fluid noted in the abdomen and pelvis, with fluid most pronounced in the right lower quadrant, cul-de-sac and adjacent to the liver. Musculoskeletal: No acute bony abnormality. IMPRESSION: Postoperative changes from earlier appendectomy. Moderate free air and free fluid in the abdomen and pelvis, presumably related to recent postoperative state. Cholelithiasis. Bibasilar atelectasis. Electronically Signed   By: Charlett Nose M.D.   On: 12/21/2019 01:03   DG Chest Port 1 View  Result Date: 12/22/2019 CLINICAL DATA:  Asthma.  Follow-up. EXAM: PORTABLE CHEST 1 VIEW COMPARISON:  11/13/2019.   CT abdomen done yesterday. FINDINGS: Nasogastric tube enters the abdomen. Slight worsening of linear atelectatic changes in the lungs, particularly the lung bases. No lobar collapse. No visible effusion. No pneumothorax. IMPRESSION: Slight worsening of postoperative atelectatic changes. Electronically Signed   By: Paulina Fusi M.D.   On: 12/22/2019 06:54    Microbiology: Recent Results (from the past 240 hour(s))  Blood culture (routine x 2)     Status: None   Collection Time: 12/21/19  1:18 AM   Specimen: BLOOD  Result Value Ref Range Status   Specimen Description BLOOD RIGHT ASSIST CONTROL  Final   Special Requests   Final    BOTTLES DRAWN AEROBIC AND ANAEROBIC Blood Culture adequate volume   Culture   Final    NO GROWTH 5 DAYS Performed at [facility], [address], [place], [place] [address]    Report Status 12/26/2019 FINAL  Final  Blood culture (routine x 2)     Status: None   Collection Time: 12/21/19  1:18 AM   Specimen: BLOOD  Result Value Ref Range Status   Specimen Description BLOOD LEFT ASSIST CONTROL  Final   Special Requests   Final    BOTTLES DRAWN AEROBIC AND ANAEROBIC Blood Culture results may not be optimal due to an inadequate volume of blood received in culture bottles   Culture   Final    NO GROWTH 5 DAYS Performed at [facility], [address] [place], [place] [address]    Report Status 12/26/2019 FINAL  Final  Culture, blood (routine x 2)     Status: None   Collection Time: 12/21/19  3:13 PM   Specimen: BLOOD  Result Value Ref Range Status   Specimen Description BLOOD [facility]  Final   Special Requests   Final    BOTTLES DRAWN AEROBIC AND ANAEROBIC Blood Culture results may not be optimal due to an inadequate volume of blood received in culture bottles   Culture   Final    NO GROWTH 5 DAYS Performed at [facility], [address] [place], [place] [address]    Report Status 12/26/2019 FINAL  Final  Culture, blood (routine  x 2)     Status: None   Collection Time: 12/21/19  3:26 PM   Specimen: BLOOD  Result Value Ref Range Status   Specimen Description BLOOD [facility]  Final   Special Requests   Final    BOTTLES DRAWN AEROBIC AND ANAEROBIC Blood Culture adequate volume   Culture   Final    NO GROWTH 5 DAYS Performed at [facility], [address] [place], [place] [address]    Report Status 12/26/2019 FINAL  Final     Labs: CBC: Recent Labs  Lab 12/23/19 0509 12/23/19 1522 12/24/19 0521 12/25/19 0521 12/26/19 0714 12/27/19 0618 12/28/19 0333  WBC 5.5  --  6.6 5.8 6.6 9.6 10.0  NEUTROABS 4.2  --  4.9 3.9 3.6  --   --   HGB 11.7*   < > 11.4* 11.1* 12.4* 12.3* 11.6*  HCT 35.9*   < > 34.6* 34.4* 38.0* 37.7* 35.3*  MCV 95.0  --  95.1 95.3 93.8 94.5 93.9  PLT 179  --  179 184 214 254 262   < > = values in this interval not displayed.   Basic Metabolic Panel: Recent Labs  Lab 12/23/19 0509 12/23/19 0509 12/24/19 0521 12/25/19 0521 12/26/19 0714 12/27/19 0618 12/28/19 0333  NA 141   < > 143 140 139 141 139  K 4.2   < > 3.9 3.8 3.5 3.3* 3.5  CL 106   < > 102 99 97* 97* 97*  CO2 29   < > 31 27 32 32 36*  GLUCOSE 157*   < > 103* 300* 260* 158* 114*  BUN 24*   < > 20 19 15 11 10   CREATININE 1.31*   < > 1.18 1.18 1.04 1.08 1.16  CALCIUM 7.9*   < > 8.1* 7.9* 7.9* 8.2* 7.8*  MG 2.3  --  2.2 1.8 1.9 1.9  --   PHOS 1.6*  --  2.2* 3.2 3.4 4.3  --    < > = values in this interval not displayed.   Liver Function Tests: No results for input(s): AST, ALT, ALKPHOS, BILITOT, PROT, ALBUMIN in the last 168 hours. No results for input(s): LIPASE, AMYLASE in the last 168 hours. No results for input(s): AMMONIA in the last 168 hours. Cardiac Enzymes: No results for input(s): CKTOTAL, CKMB, CKMBINDEX, TROPONINI in the last 168 hours. BNP (last  3 results) No results for input(s): BNP in the last 8760 hours. CBG: Recent Labs  Lab 12/27/19 1718 12/27/19 1811 12/27/19 2108 12/28/19 0339  12/28/19 0759  GLUCAP 60* 72 163* 101* 80    Time spent: 35 minutes  Signed:  Gillis Santa  [facility]  12/28/2019 11:19 AM

## 2019-12-28 NOTE — Progress Notes (Signed)
Brief Progress Note  Wound Vac: Patient will require a wound vac for home given midline wound healing via secondary intention following exploratory laparotomy. The goal of the wound vac is to expedite wound healing and prevent any wound infection compared to wet-to-drys. He has tolerated the wound vac well in the hospital and wound appears to be healing well.   Midline wound Measurements:  Length: 16 cm Width: 4 cm Depth: 3 cm  He will continue with these vac changes on a MWF schedule.   Plan to follow up with general surgery in 1 week  -- Lynden Oxford, PA-C [facility] 12/28/2019, 12:09 PM [phone] M-F: 7am - 4pm

## 2019-12-28 NOTE — Progress Notes (Signed)
Pharmacy Antibiotic Note  Richard [name] is a 29 y.o. male admitted on 12/20/2019 with intra-abdominal infection.  Pharmacy has been consulted for Zosyn dosing.  Plan:  Will continue Zosyn 3.375g IV q8h (4 hour infusion) with plan to discharge patient today on oral Augmentin 875/125 PO BID x 7 days, total of 14 days of therapy.  Height: 5\' 1"  (154.9 cm) Weight: 200 lb 2.8 oz (90.8 kg) IBW/kg (Calculated) : 52.3  Temp (24hrs), Avg:97.8 F (36.6 C), Min:97.5 F (36.4 C), Max:98.2 F (36.8 C)  Recent Labs  Lab 12/22/19 1730 12/23/19 0509 12/24/19 0521 12/25/19 0521 12/26/19 0714 12/27/19 0618 12/28/19 0333  WBC  --    < > 6.6 5.8 6.6 9.6 10.0  CREATININE  --    < > 1.18 1.18 1.04 1.08 1.16  LATICACIDVEN 1.2  --   --   --   --   --   --    < > = values in this interval not displayed.    Estimated Creatinine Clearance: 92.4 mL/min (by C-G formula based on SCr of 1.16 mg/dL).    Allergies  Allergen Reactions  . Sulfa Antibiotics Anaphylaxis    Antimicrobials this admission: Zosyn 1/29 >>  Dose adjustments this admission:   Microbiology results: 1/29 AM BCx: NGF x2 1/29 PM BCx: NGF x2  Thank you for allowing pharmacy to be a part of this patient's care.  Chanan Detwiler A Ronzell Laban 12/28/2019 9:19 AM

## 2020-01-01 ENCOUNTER — Other Ambulatory Visit: Payer: Medicare Other

## 2020-01-03 ENCOUNTER — Encounter: Payer: Self-pay | Admitting: General Surgery

## 2020-01-03 ENCOUNTER — Ambulatory Visit (INDEPENDENT_AMBULATORY_CARE_PROVIDER_SITE_OTHER): Payer: Self-pay | Admitting: General Surgery

## 2020-01-03 ENCOUNTER — Other Ambulatory Visit: Payer: Self-pay

## 2020-01-03 VITALS — BP 137/84 | HR 123 | Temp 97.9°F | Ht 61.0 in | Wt 200.0 lb

## 2020-01-03 DIAGNOSIS — R100 Acute abdomen: Secondary | ICD-10-CM

## 2020-01-03 MED ORDER — OXYCODONE HCL 5 MG PO TABS: 5.0000 mg | ORAL_TABLET | ORAL | 0 refills | Status: DC | PRN

## 2020-01-03 NOTE — Progress Notes (Signed)
Richard [name] is here today for an unscheduled postop visit.  He states that he confused the dates and thought that today was the 18th.  He is accompanied today by his husband.  He reports that he has been doing somewhat poorly at home, with poorly controlled pain and as a result, his appetite has also been poor.  He says that sometimes the pain gets to be so severe that he becomes nauseated and vomits.  He has been doing well with his wound VAC dressing changes.  He continues to take antibiotics and has noticed some loose stools as a result.  He is only using the oxycodone that he was prescribed for pain control.  Today's Vitals   01/03/20 0907  BP: 137/84  Pulse: (!) 123  Temp: 97.9 F (36.6 C)  TempSrc: Temporal  SpO2: 95%  Weight: 200 lb (90.7 kg)  Height: 5\' 1"  (1.549 m)  PainSc: 9   PainLoc: Abdomen   Body mass index is 37.79 kg/m. Focused abdominal exam: The wound VAC is in situ and has a good seal.  There is no surrounding erythema, induration, or drainage.  The laparoscopic port sites are also healing nicely.  The drain site was examined.  There is some irritation present and I found a small fragment of nylon suture retained.  This was removed and the site redressed.  Impression and Plan: This is a 29 year old man who underwent an interval appendectomy after having perforated appendicitis.  The appendectomy was complicated by what appears to have been a small thermal injury which resulted in a perforation of the terminal ileum.  He subsequently had an exploratory laparotomy with primary repair of the small perforation site.  He is having difficulty with pain control.  He is only taking the oxycodone that was prescribed when he is in pain.  I have recommended that he take scheduled ibuprofen, 600 mg every 6 hours, alternating with Tylenol, 650 mg every 6 hours, such that he is taking something for pain every 3 hours.  He may continue to use the oxycodone for severe pain or breakthrough pain  or pain associated with his dressing changes.  I have sent in a prescription so that he will have adequate tablets for this purpose.  We will plan to see him back next week for a recheck.

## 2020-01-03 NOTE — Patient Instructions (Addendum)
Patient is is instructed to take Ibuprofen 600 mg every six hours. Patient is instructed to take 650 mg of Tylenol every six hours. Patient is to take the Ibuprofen total the first three hours and then patient is to take Tylenol the next three hours for pain and discomfort.  Dr.Cannon will refill patient prescription of Oxycodone. Patient is advised to try saltine crackers and ginger ale or sprite to help with the decreased appetite. Allow the drink to go flat and re-refridgerate to decrease the bubbles in the drink to avoid causing bloating in the patient. Patient may try Activita yogurt to help with the Probiotics.  GENERAL POST-OPERATIVE PATIENT INSTRUCTIONS   FOLLOW-UP:  Please make an appointment with your physician in.  Call your physician immediately if you have any fevers greater than 102.5, drainage from you wound that is not clear or looks infected, persistent bleeding, increasing abdominal pain, problems urinating, or persistent nausea/vomiting.    WOUND CARE INSTRUCTIONS:  Keep a dry clean dressing on the wound if there is drainage. The initial bandage may be removed after 24 hours.  Once the wound has quit draining you may leave it open to air.  If clothing rubs against the wound or causes irritation and the wound is not draining you may cover it with a dry dressing during the daytime.  Try to keep the wound dry and avoid ointments on the wound unless directed to do so.  If the wound becomes bright red and painful or starts to drain infected material that is not clear, please contact your physician immediately.  If the wound is mildly pink and has a thick firm ridge underneath it, this is normal, and is referred to as a healing ridge.  This will resolve over the next 4-6 weeks.  DIET:  You may eat any foods that you can tolerate.  It is a good idea to eat a high fiber diet and take in plenty of fluids to prevent constipation.  If you do become constipated you may want to take a mild laxative  or take ducolax tablets on a daily basis until your bowel habits are regular.  Constipation can be very uncomfortable, along with straining, after recent surgery.  ACTIVITY:  You are encouraged to cough and deep breath or use your incentive spirometer if you were given one, every 15-30 minutes when awake.  This will help prevent respiratory complications and low grade fevers post-operatively if you had a general anesthetic.  You may want to hug a pillow when coughing and sneezing to add additional support to the surgical area, if you had abdominal or chest surgery, which will decrease pain during these times.  You are encouraged to walk and engage in light activity for the next two weeks.  You should not lift more than 20 pounds during this time frame as it could put you at increased risk for complications.  Twenty pounds is roughly equivalent to a plastic bag of groceries.    MEDICATIONS:  Try to take narcotic medications and anti-inflammatory medications, such as tylenol, ibuprofen, naprosyn, etc., with food.  This will minimize stomach upset from the medication.  Should you develop nausea and vomiting from the pain medication, or develop a rash, please discontinue the medication and contact your physician.  You should not drive, make important decisions, or operate machinery when taking narcotic pain medication.  QUESTIONS:  Please feel free to call your physician or the hospital operator if you have any questions, and they will be  glad to assist you.

## 2020-01-10 ENCOUNTER — Encounter: Payer: Medicare Other | Admitting: General Surgery

## 2020-01-17 ENCOUNTER — Other Ambulatory Visit: Payer: Self-pay

## 2020-01-17 ENCOUNTER — Encounter: Payer: Self-pay | Admitting: General Surgery

## 2020-01-17 ENCOUNTER — Ambulatory Visit (INDEPENDENT_AMBULATORY_CARE_PROVIDER_SITE_OTHER): Payer: Self-pay | Admitting: General Surgery

## 2020-01-17 VITALS — BP 120/84 | HR 107 | Temp 97.0°F | Resp 14 | Ht 61.0 in | Wt 168.4 lb

## 2020-01-17 DIAGNOSIS — R100 Acute abdomen: Secondary | ICD-10-CM

## 2020-01-17 MED ORDER — TRAMADOL HCL 50 MG PO TABS: 50.0000 mg | ORAL_TABLET | Freq: Four times a day (QID) | ORAL | 0 refills | Status: DC | PRN

## 2020-01-17 NOTE — Patient Instructions (Addendum)
Please pick up your prescription at your local pharmacy. Continue to alternate the Tylenol and Ibuprofen for pain as needed.   We will see you in 3 weeks to have Dr Lady Gary take a look at your wound. Please bring your wound vac supplies to this visit. Have you wound vac nurse do a wet to dry dressing change on the Wednesday prior to you coming in.   Please call the office if you have any questions or concerns.

## 2020-01-17 NOTE — Progress Notes (Signed)
Richard [name] is here today for a follow-up visit.  He is a 29 year old man who had perforated appendicitis that was treated conservatively.  He had an interval appendectomy on December 20, 2019.  Unfortunately, that procedure was complicated by injury to the terminal ileum and he presented back to the emergency department the next day.  He had an exploratory laparotomy with repair of a small thermal injury to the terminal ileum on the 29th.  I last saw him on February 11.  At that time, his pain was poorly controlled.  We put him on a regimen of scheduled ibuprofen and Tylenol with oxycodone for dressing changes.  He has had his wound VAC changed by home health nursing and he reports that this is going well.  His appetite is improving and he is having normal bowel movements.  No nausea or vomiting.  No fever or chills.  He says that his pain is much better but that he does not like the way he feels on oxycodone.  He would like something a little less strong for his VAC dressing changes.  Today's Vitals   01/17/20 1115  BP: 120/84  Pulse: (!) 107  Resp: 14  Temp: (!) 97 F (36.1 C)  TempSrc: Temporal  SpO2: 95%  Weight: 168 lb 6.4 oz (76.4 kg)  Height: 5\' 1"  (1.549 m)  PainSc: 7    Body mass index is 31.82 kg/m. Focused abdominal exam: The wound VAC is in place and has a good seal.  There is no surrounding erythema.  The patient's abdomen is nontender.  Pression plan: This is a 29 year old man who had an appendectomy complicated by thermal injury to the terminal ileum, now status post exploratory laparotomy and repair.  He is doing well with his wound VAC.  He reports that the home health nurse thinks he will only need it for a couple more weeks.  I have a prescription for tramadol for him to use during his dressing changes.  He will continue to use ibuprofen and Tylenol for pain control.  I would like to see him in clinic in 3 weeks time for reevaluation.

## 2020-02-07 ENCOUNTER — Other Ambulatory Visit: Payer: Self-pay

## 2020-02-07 ENCOUNTER — Encounter: Payer: Self-pay | Admitting: General Surgery

## 2020-02-07 ENCOUNTER — Ambulatory Visit (INDEPENDENT_AMBULATORY_CARE_PROVIDER_SITE_OTHER): Payer: Self-pay | Admitting: General Surgery

## 2020-02-07 VITALS — BP 113/84 | HR 121 | Temp 96.4°F | Ht 61.0 in | Wt 171.8 lb

## 2020-02-07 DIAGNOSIS — K3533 Acute appendicitis with perforation and localized peritonitis, with abscess: Secondary | ICD-10-CM

## 2020-02-07 NOTE — Progress Notes (Signed)
Richard [name] is here today for a postop visit.  He is a 29 year old man who had a laparoscopic appendectomy on 20 December 2019.  Unfortunately, this operation was complicated by a small thermal injury to the terminal ileum which resulted in the need for an ex lap the following day.  The perforation was primarily repaired, and he has done well since then.  Due to the amount of soilage and the patient's body habitus, I elected to utilize a wound VAC rather than close the skin.  At our last visit, the wound was healing very nicely.  He continued with wound VAC dressing changes with home health nursing and reports that he has no drainage and that the site has nearly closed.  He denies any fevers or chills.  No nausea or vomiting.  His appetite is normal as is his bowel function.  He is tolerating his dressing changes without difficulty.  Today's Vitals   02/07/20 1413  BP: 113/84  Pulse: (!) 121  Temp: (!) 96.4 F (35.8 C)  TempSrc: Temporal  SpO2: 94%  Weight: 171 lb 12.8 oz (77.9 kg)  Height: 5\' 1"  (1.549 m)  PainSc: 0-No pain   Body mass index is 32.46 kg/m. Focused examination: The wound is currently dressed with a wet-to-dry dressing.  The gauze was removed to reveal a beautiful bed of granulation tissue.  The wound is closed around the umbilicus and the remaining open areas are nearly flush with the skin.  Impression and plan: is doing well from his exploratory laparotomy and his wound is healing well.  At this time, I think we can discontinue the wound VAC and just perform wet-to-dry dressings on a daily basis.  I will see him back in 2 to 3 weeks for what will hopefully be a final wound check.

## 2020-02-07 NOTE — Patient Instructions (Addendum)
Dr.Cannon changed patient's dressing at today's visit.  Dr.Cannon recommend patient to continue wet to dry dressing change. Patient no longer needs Wound Vac.  Patient may continue with showering with warm, soapy water and allow water to run over wound.   Patient we have one more office visit and follow up with Dr.Cannon in three weeks.

## 2020-02-28 ENCOUNTER — Ambulatory Visit (INDEPENDENT_AMBULATORY_CARE_PROVIDER_SITE_OTHER): Payer: Self-pay | Admitting: General Surgery

## 2020-02-28 ENCOUNTER — Encounter: Payer: Self-pay | Admitting: General Surgery

## 2020-02-28 ENCOUNTER — Other Ambulatory Visit: Payer: Self-pay

## 2020-02-28 VITALS — BP 119/80 | HR 111 | Temp 97.2°F | Ht 63.0 in | Wt 173.8 lb

## 2020-02-28 DIAGNOSIS — K3533 Acute appendicitis with perforation and localized peritonitis, with abscess: Secondary | ICD-10-CM

## 2020-02-28 NOTE — Progress Notes (Signed)
Richard [name] is here today for a final postoperative check.  He is a 29 year old man who had perforated appendicitis in November which was treated conservatively.  He underwent interval appendectomy on December 19, 2019.  He presented back to the emergency department the following night with peritonitis that was ultimately found to be due to a small perforation, likely a thermal injury, at the terminal ileum.  He underwent exploratory laparotomy with primary repair of the perforation site on December 21, 2019.  Due to the degree of contamination as well as his body habitus, the wound was left to heal by secondary intention.  He had a wound VAC up until his last visit in our office which was February 07, 2020.  Since then, he has had moist dressing changes and the wound has closed completely.  He is here for 1 last check of his incision.  He reports that he is doing well.  His activity levels are normal, as is his appetite and bowel function.  No fevers or chills.  No nausea or vomiting.  Today's Vitals   02/28/20 1355  BP: 119/80  Pulse: (!) 111  Temp: (!) 97.2 F (36.2 C)  TempSrc: Temporal  SpO2: 93%  Weight: 173 lb 12.8 oz (78.8 kg)  Height: 5\' 3"  (1.6 m)  PainSc: 0-No pain  PainLoc: Abdomen   Body mass index is 30.79 kg/m. Focused abdominal exam: His vertical midline incision is healing nicely.  It has completely epithelialized.  It still has some hyperpigmentation present.  The skin is somewhat scaly and dry.  Impression and plan: This is a 29 year old man who had a laparoscopic appendectomy complicated by thermal injury to the terminal ileum.  Subsequent exploratory laparotomy enabled primary repair of the site.  His wound has come lately healed at this time.  I recommended that he apply a moisturizing agent, such as Aquaphor, vitamin E oil or similar emollient agent to the incision to minimize the scaling and try to avoid scar hypertrophy.  He should also apply sunscreen to minimize  hyperpigmentation.  I will see him back on an as-needed basis.

## 2020-02-28 NOTE — Patient Instructions (Signed)
Dr.Cannon suggested to patient he may try Aquaphor or Cetaphil to help moisturize the area.  Dr.Cannon also advised patient to keep the area protect during sunlight with sunscreen.   Follow-up with our office as needed.  Please call and ask to speak with a nurse if you develop questions or concerns.

## 2021-01-26 IMAGING — DX DG CHEST 1V PORT
1 series · 1 of 1 positions shown · non-contrast
Comparison: 11/13/2019.  CT abdomen done yesterday.

CLINICAL DATA: Asthma.  Follow-up.

EXAM:
PORTABLE CHEST 1 VIEW

[chest ap]
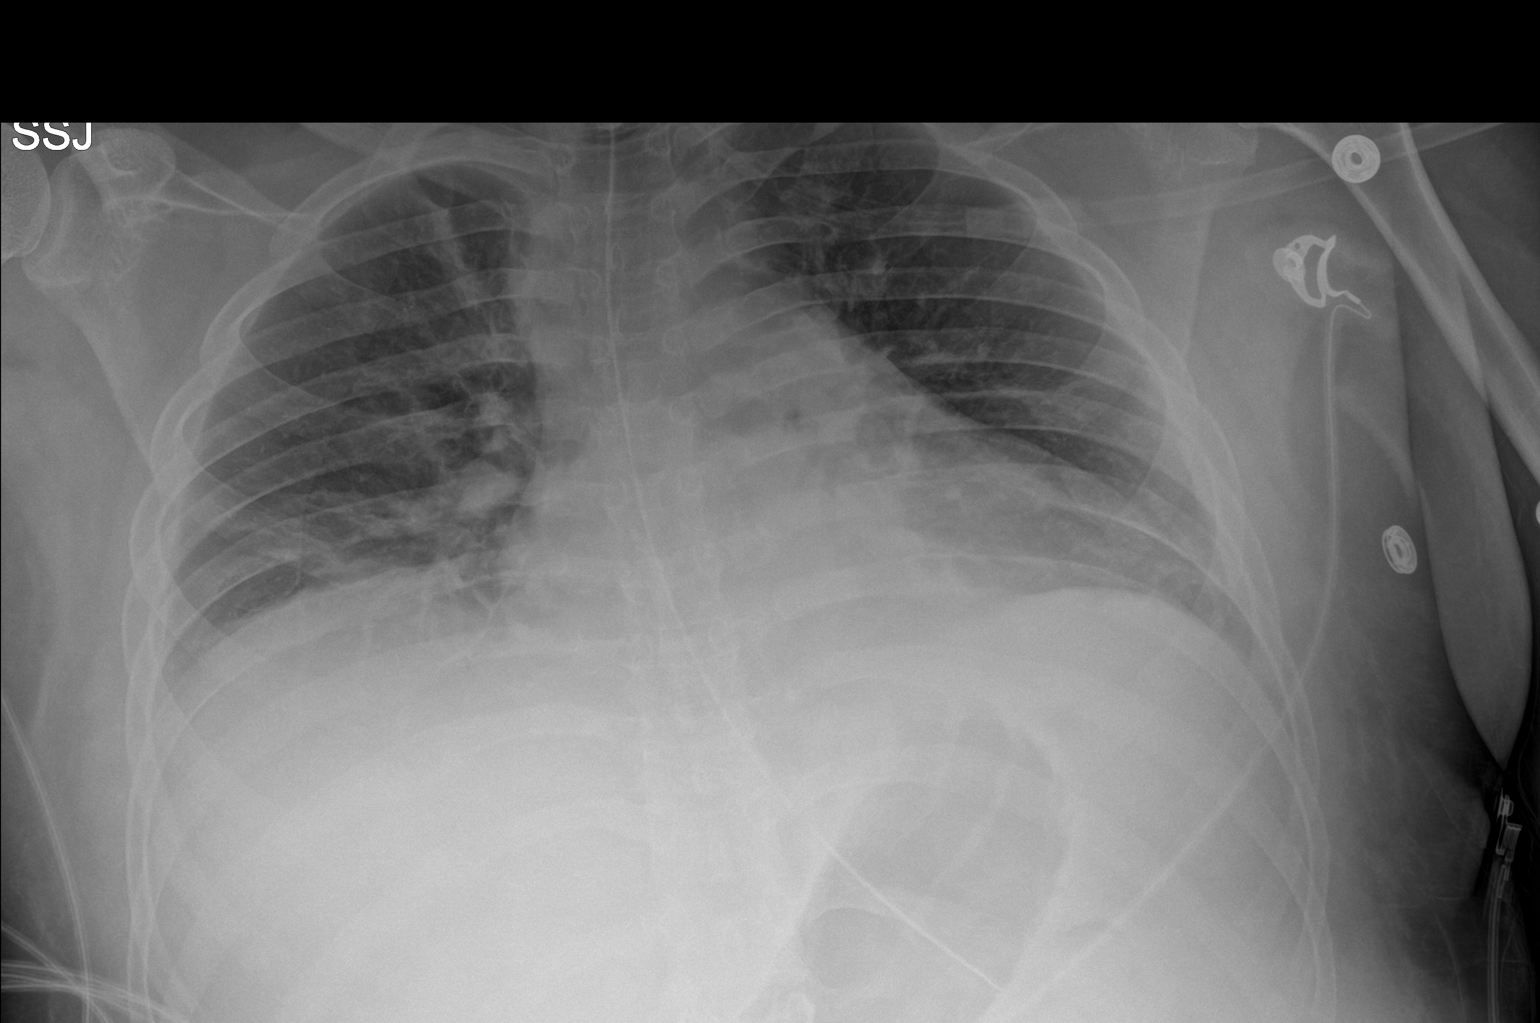

[1 of 1 positions shown; findings below may reference images not displayed]

FINDINGS: Nasogastric tube enters the abdomen. Slight worsening of linear
atelectatic changes in the lungs, particularly the lung bases. No
lobar collapse. No visible effusion. No pneumothorax.
IMPRESSION: Slight worsening of postoperative atelectatic changes.

## 2021-08-11 ENCOUNTER — Encounter: Payer: Self-pay | Admitting: General Surgery
